# Patient Record
Sex: Male | Born: 1955 | Race: Black or African American | Hispanic: No | Smoking: Never smoker
Health system: Southern US, Community
[De-identification: ages and names within clinical notes are randomized; demographics above are authoritative.]

## PROBLEM LIST (undated history)

## (undated) DIAGNOSIS — F1020 Alcohol dependence, uncomplicated: Secondary | ICD-10-CM

## (undated) DIAGNOSIS — F329 Major depressive disorder, single episode, unspecified: Secondary | ICD-10-CM

## (undated) DIAGNOSIS — F419 Anxiety disorder, unspecified: Secondary | ICD-10-CM

## (undated) DIAGNOSIS — G9340 Encephalopathy, unspecified: Secondary | ICD-10-CM

---

## 2011-01-02 ENCOUNTER — Inpatient Hospital Stay (HOSPITAL_COMMUNITY)
Admission: EM | Admit: 2011-01-02 | Discharge: 2011-01-12 | DRG: 151 | Disposition: A | Discharge status: Home / Self Care | Payer: PRIVATE HEALTH INSURANCE | Attending: Family Medicine | Admitting: Family Medicine

## 2011-01-02 ENCOUNTER — Inpatient Hospital Stay (INDEPENDENT_AMBULATORY_CARE_PROVIDER_SITE_OTHER)
Admission: RE | Admit: 2011-01-02 | Discharge: 2011-01-02 | Disposition: A | Discharge status: Home / Self Care | Payer: Self-pay | Source: Ambulatory Visit | Attending: Family Medicine | Admitting: Family Medicine

## 2011-01-02 ENCOUNTER — Emergency Department (HOSPITAL_COMMUNITY)
Admission: EM | Admit: 2011-01-02 | Discharge: 2011-01-02 | Disposition: A | Discharge status: Home / Self Care | Payer: PRIVATE HEALTH INSURANCE | Attending: Emergency Medicine | Admitting: Emergency Medicine

## 2011-01-02 ENCOUNTER — Emergency Department (HOSPITAL_COMMUNITY): Payer: PRIVATE HEALTH INSURANCE

## 2011-01-02 DIAGNOSIS — T3695XA Adverse effect of unspecified systemic antibiotic, initial encounter: Secondary | ICD-10-CM | POA: Diagnosis not present

## 2011-01-02 DIAGNOSIS — D72829 Elevated white blood cell count, unspecified: Secondary | ICD-10-CM | POA: Diagnosis present

## 2011-01-02 DIAGNOSIS — K921 Melena: Secondary | ICD-10-CM | POA: Diagnosis present

## 2011-01-02 DIAGNOSIS — IMO0002 Reserved for concepts with insufficient information to code with codable children: Secondary | ICD-10-CM | POA: Diagnosis not present

## 2011-01-02 DIAGNOSIS — F29 Unspecified psychosis not due to a substance or known physiological condition: Secondary | ICD-10-CM | POA: Diagnosis not present

## 2011-01-02 DIAGNOSIS — R197 Diarrhea, unspecified: Secondary | ICD-10-CM | POA: Diagnosis not present

## 2011-01-02 DIAGNOSIS — R7402 Elevation of levels of lactic acid dehydrogenase (LDH): Secondary | ICD-10-CM | POA: Diagnosis present

## 2011-01-02 DIAGNOSIS — F3289 Other specified depressive episodes: Secondary | ICD-10-CM | POA: Diagnosis present

## 2011-01-02 DIAGNOSIS — R000 Tachycardia, unspecified: Secondary | ICD-10-CM | POA: Insufficient documentation

## 2011-01-02 DIAGNOSIS — I10 Essential (primary) hypertension: Secondary | ICD-10-CM | POA: Diagnosis present

## 2011-01-02 DIAGNOSIS — F1020 Alcohol dependence, uncomplicated: Secondary | ICD-10-CM | POA: Diagnosis present

## 2011-01-02 DIAGNOSIS — D62 Acute posthemorrhagic anemia: Secondary | ICD-10-CM | POA: Diagnosis present

## 2011-01-02 DIAGNOSIS — R040 Epistaxis: Secondary | ICD-10-CM | POA: Insufficient documentation

## 2011-01-02 DIAGNOSIS — R7401 Elevation of levels of liver transaminase levels: Secondary | ICD-10-CM | POA: Diagnosis present

## 2011-01-02 DIAGNOSIS — F10239 Alcohol dependence with withdrawal, unspecified: Secondary | ICD-10-CM | POA: Diagnosis not present

## 2011-01-02 DIAGNOSIS — E876 Hypokalemia: Secondary | ICD-10-CM | POA: Diagnosis present

## 2011-01-02 DIAGNOSIS — F10939 Alcohol use, unspecified with withdrawal, unspecified: Secondary | ICD-10-CM | POA: Diagnosis not present

## 2011-01-02 DIAGNOSIS — F1010 Alcohol abuse, uncomplicated: Secondary | ICD-10-CM | POA: Diagnosis present

## 2011-01-02 DIAGNOSIS — F329 Major depressive disorder, single episode, unspecified: Secondary | ICD-10-CM | POA: Diagnosis present

## 2011-01-02 DIAGNOSIS — R1110 Vomiting, unspecified: Secondary | ICD-10-CM | POA: Insufficient documentation

## 2011-01-02 LAB — POCT I-STAT, CHEM 8
BUN: 14 mg/dL (ref 6–23)
Chloride: 101 mEq/L (ref 96–112)
HCT: 43 % (ref 39.0–52.0)
Sodium: 138 mEq/L (ref 135–145)

## 2011-01-02 LAB — COMPREHENSIVE METABOLIC PANEL
ALT: 53 U/L (ref 0–53)
ALT: 49 U/L (ref 0–53)
AST: 73 U/L — ABNORMAL HIGH (ref 0–37)
AST: 56 U/L — ABNORMAL HIGH (ref 0–37)
Albumin: 3.7 g/dL (ref 3.5–5.2)
BUN: 25 mg/dL — ABNORMAL HIGH (ref 6–23)
CO2: 23 mEq/L (ref 19–32)
Calcium: 9.6 mg/dL (ref 8.4–10.5)
Creatinine, Ser: 0.71 mg/dL (ref 0.4–1.5)
Creatinine, Ser: 0.69 mg/dL (ref 0.4–1.5)
GFR calc Af Amer: >60 mL/min (ref >60)
GFR calc non Af Amer: >60 mL/min (ref >60)
GFR calc non Af Amer: >60 mL/min (ref >60)
Glucose, Bld: 126 mg/dL — ABNORMAL HIGH (ref 70–99)
Potassium: 3.8 mEq/L (ref 3.5–5.1)
Sodium: 140 mEq/L (ref 135–145)
Total Bilirubin: 0.6 mg/dL (ref 0.3–1.2)

## 2011-01-02 LAB — DIFFERENTIAL
Basophils Relative: 0 % (ref 0–1)
Eosinophils Absolute: 0 10*3/uL (ref 0.0–0.7)
Eosinophils Relative: 0 % (ref 0–5)
Lymphocytes Relative: 4 % — ABNORMAL LOW (ref 12–46)
Lymphs Abs: 1.6 10*3/uL (ref 0.7–4.0)
Lymphs Abs: 0.6 10*3/uL — ABNORMAL LOW (ref 0.7–4.0)
Monocytes Absolute: 1.2 10*3/uL — ABNORMAL HIGH (ref 0.1–1.0)
Monocytes Relative: 7 % (ref 3–12)
Monocytes Relative: 8 % (ref 3–12)
Neutro Abs: 12.3 10*3/uL — ABNORMAL HIGH (ref 1.7–7.7)
Neutrophils Relative %: 82 % — ABNORMAL HIGH (ref 43–77)

## 2011-01-02 LAB — CBC
HCT: 34.7 % — ABNORMAL LOW (ref 39.0–52.0)
Hemoglobin: 12.9 g/dL — ABNORMAL LOW (ref 13.0–17.0)
MCH: 30.3 pg (ref 26.0–34.0)
MCH: 29.8 pg (ref 26.0–34.0)
MCHC: 34.6 g/dL (ref 30.0–36.0)
MCV: 86.4 fL (ref 78.0–100.0)
MCV: 86.1 fL (ref 78.0–100.0)
Platelets: 154 10*3/uL (ref 150–400)
RBC: 4.26 MIL/uL (ref 4.22–5.81)
RDW: 14.1 % (ref 11.5–15.5)
WBC: 15.1 10*3/uL — ABNORMAL HIGH (ref 4.0–10.5)
WBC: 14.9 10*3/uL — ABNORMAL HIGH (ref 4.0–10.5)

## 2011-01-02 LAB — GLUCOSE, CAPILLARY: Glucose-Capillary: 123 mg/dL — ABNORMAL HIGH (ref 70–99)

## 2011-01-02 LAB — URINALYSIS, ROUTINE W REFLEX MICROSCOPIC
Bilirubin Urine: NEGATIVE
Hgb urine dipstick: NEGATIVE
Specific Gravity, Urine: 1.018 (ref 1.005–1.030)
Urobilinogen, UA: 0.2 mg/dL (ref 0.0–1.0)

## 2011-01-02 LAB — RAPID URINE DRUG SCREEN, HOSP PERFORMED
Amphetamines: NOT DETECTED
Benzodiazepines: NOT DETECTED
Opiates: NOT DETECTED
Tetrahydrocannabinol: NOT DETECTED

## 2011-01-02 LAB — APTT: aPTT: 24 seconds (ref 24–37)

## 2011-01-02 LAB — PROTIME-INR: Prothrombin Time: 13.1 seconds (ref 11.6–15.2)

## 2011-01-03 LAB — COMPREHENSIVE METABOLIC PANEL
ALT: 40 U/L (ref 0–53)
AST: 42 U/L — ABNORMAL HIGH (ref 0–37)
Albumin: 3.6 g/dL (ref 3.5–5.2)
Calcium: 9 mg/dL (ref 8.4–10.5)
GFR calc Af Amer: >60 mL/min (ref >60)
Sodium: 142 mEq/L (ref 135–145)
Total Protein: 7.1 g/dL (ref 6.0–8.3)

## 2011-01-03 LAB — CBC
HCT: 30.2 % — ABNORMAL LOW (ref 39.0–52.0)
Hemoglobin: 11.3 g/dL — ABNORMAL LOW (ref 13.0–17.0)
Hemoglobin: 10.5 g/dL — ABNORMAL LOW (ref 13.0–17.0)
MCHC: 34.5 g/dL (ref 30.0–36.0)
Platelets: 164 10*3/uL (ref 150–400)
RBC: 3.44 MIL/uL — ABNORMAL LOW (ref 4.22–5.81)
RDW: 14.4 % (ref 11.5–15.5)
RDW: 14.4 % (ref 11.5–15.5)
WBC: 10 10*3/uL (ref 4.0–10.5)

## 2011-01-03 LAB — HEMOGLOBIN AND HEMATOCRIT, BLOOD: Hemoglobin: 10.9 g/dL — ABNORMAL LOW (ref 13.0–17.0)

## 2011-01-03 LAB — T3: T3, Total: 67 ng/dl — ABNORMAL LOW (ref 80.0–204.0)

## 2011-01-03 LAB — APTT: aPTT: 23 seconds — ABNORMAL LOW (ref 24–37)

## 2011-01-03 LAB — DIFFERENTIAL
Basophils Absolute: 0 10*3/uL (ref 0.0–0.1)
Basophils Relative: 0 % (ref 0–1)
Eosinophils Absolute: 0 10*3/uL (ref 0.0–0.7)
Eosinophils Relative: 0 % (ref 0–5)
Monocytes Absolute: 0.9 10*3/uL (ref 0.1–1.0)
Neutro Abs: 9.4 10*3/uL — ABNORMAL HIGH (ref 1.7–7.7)

## 2011-01-03 LAB — TSH: TSH: 1.015 u[IU]/mL (ref 0.350–4.500)

## 2011-01-03 LAB — PROTIME-INR: INR: 0.91 (ref 0.00–1.49)

## 2011-01-03 LAB — T4: T4, Total: 4.8 ug/dL — ABNORMAL LOW (ref 5.0–12.5)

## 2011-01-03 NOTE — H&P (Signed)
[name], [name]             ACCOUNT NO.:  [id number]  MEDICAL RECORD NO.:  [id number]           PATIENT TYPE:  LOCATION:                                 FACILITY:  PHYSICIAN:  Talmage Nap, MD  DATE OF BIRTH:  1955/10/16  DATE OF ADMISSION:  01/02/2011 DATE OF DISCHARGE:                             HISTORY & PHYSICAL   PRIMARY CARE PHYSICIAN:  Unassigned.  History obtainable from the patient and the patient's sister.  CHIEF COMPLAINT:  Recurrent nosebleed for about a week duration.  HISTORY:  The patient is a 55 year old African American male with no known medical history, but has a history of chronic alcohol abuse presenting to the emergency room with a nosebleed, which was said to have been recurrent for the past 1 week, but got progressively worse 24 hours prior to presenting to the emergency room.  The patient was said to have denied any history of trauma.  He denied any history of nose picking.  He denied any fever.  He denied any rigor.  Initially, nosebleed was said to wax and wane over 24 hours prior to presenting to the emergency room.  The bleeding was said to have been very torrential and was coming from both nostrils.  The patient was said to have been getting progressively weak and subsequently was brought to the emergency room by his sister for evaluation.  PAST MEDICAL HISTORY:  Unknown.  PAST SURGICAL HISTORY:  No known past surgical history.  MEDICATIONS:  Not on any medications.  ALLERGIES:  No known allergies.  SOCIAL HISTORY:  The patient is a heavy drinker, cannot quantify the number of bottles of alcohol he takes every day.  Denies any history of tobacco or street drug use.  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  FAMILY HISTORY:  Positive for bleeding diastasis.  REVIEW OF SYSTEMS:  The patient denies any history of headaches.  No blurred vision.  No nausea or vomiting.  No fever.  No chills.  No rigor.  No chest pain or  shortness of breath.  Complained of persistent nosebleed, worse on the left than on the right.  No cough.  No abdominal discomfort.  No diarrhea or hematochezia.  No dysuria or hematuria.  No swelling of the lower extremities.  No intolerance to heat or cold and no neuropsychiatric disorder.  PHYSICAL EXAMINATION:  GENERAL:  A middle-aged man not in any acute respiratory distress, suboptimal hydration present. VITAL SIGNS:  Blood pressure is 177/112, pulse is 136, respiratory rate 18, saturating 95% on room air. HEENT:  Pallor.  Pupils are reactive to light.  Extraocular muscles are intact.  He has a nasal packing in situ in the left nostril. NECK:  No jugular venous distention.  No carotid bruits.  No lymphadenopathy. CHEST:  Clear to auscultation. HEART:  Sounds are S1, S2.  Tachycardic. ABDOMEN:  Soft, nontender.  Liver, spleen, kidney not palpable.  Bowel sounds are positive. EXTREMITIES:  No pedal edema. NEUROLOGIC:  Nonfocal. MUSCULOSKELETAL:  Unremarkable. SKIN:  Shows slightly decreased turgor.  LABORATORY DATA:  Urine drug screen negative.  Urinalysis unremarkable. Alcohol level is less  than 11.  Chemistry shows sodium of 140, potassium of 3.8, chloride of 100 with bicarb of 27, glucose is 126, BUN is 25, creatinine is 0.69.  LFTs showed total protein is 7.5; albumin is 3.9; AST 56, elevated; ALT 49, normal; and alkaline phosphatase is 90, normal.  Hematological indices showed WBC of 14.9, hemoglobin of 12.0, hematocrit of 34.7, MCV of 86.2 with a platelet count of 454, neutrophils 80%, and absolute neutrophil count is 10.2.  IMAGING STUDIES:  Done include chest x-ray, which was essentially normal.  IMPRESSION: 1. Recurrent epistaxis, status post nasal packing. 2. Tachycardia. 3. Elevated blood pressure. 4. Anemia. 5. Leukocytosis. 6. Abnormal liver function tests secondary to chronic EtOH use. 7. History of EtOH use.  PLAN:  Plan is to admit the patient to  telemetry because of the tachycardia and elevated blood pressures.  He will be on half-normal saline IV to go at a rate of 65 cc an hour.  We will continue nasal packing.  He will be on clindamycin 600 mg IV q.8 hourly, Vasotec 1.25 mg IV q.6 and then Lopressor 5 mg IV q.6 p.r.n. for heart beat greater than 90 beats per minute. GI prophylaxis will be with Protonix 40 IV q.24 and then DVT prophylaxis with TED stockings and SCDs boots.  Labs to be ordered on this patient include hemoglobin and hematocrit q.6 h.; PT/PTT/INR stat; thyroid panel, which include TSH, T3, and T4; CBC, CMP, and magnesium repeated in a.m.  The patient will have a blood culture x2 done before starting IV antibiotics.  I was informed by the ED physician that ENT surgeon has been consulted.  The patient will be followed and evaluated on a daily basis.     Talmage Nap, MD     CN/MEDQ  D:  01/03/2011  T:  01/03/2011  Job:  [id number]  Electronically Signed by Talmage Nap  on 01/03/2011 07:23:36 AM

## 2011-01-04 LAB — COMPREHENSIVE METABOLIC PANEL
ALT: 34 U/L (ref 0–53)
AST: 52 U/L — ABNORMAL HIGH (ref 0–37)
CO2: 28 mEq/L (ref 19–32)
Chloride: 102 mEq/L (ref 96–112)
Creatinine, Ser: 0.77 mg/dL (ref 0.4–1.5)
GFR calc Af Amer: >60 mL/min (ref >60)
GFR calc non Af Amer: >60 mL/min (ref >60)
Total Bilirubin: 0.6 mg/dL (ref 0.3–1.2)

## 2011-01-04 LAB — CBC
Hemoglobin: 11 g/dL — ABNORMAL LOW (ref 13.0–17.0)
MCH: 30.5 pg (ref 26.0–34.0)
RBC: 3.61 MIL/uL — ABNORMAL LOW (ref 4.22–5.81)

## 2011-01-05 LAB — CBC
MCH: 30.9 pg (ref 26.0–34.0)
MCHC: 35.4 g/dL (ref 30.0–36.0)
MCHC: 34.5 g/dL (ref 30.0–36.0)
MCV: 87.4 fL (ref 78.0–100.0)
MCV: 87.4 fL (ref 78.0–100.0)
Platelets: 159 10*3/uL (ref 150–400)
Platelets: 155 10*3/uL (ref 150–400)
RDW: 13.6 % (ref 11.5–15.5)
RDW: 13.6 % (ref 11.5–15.5)
WBC: 9.8 10*3/uL (ref 4.0–10.5)

## 2011-01-05 LAB — BASIC METABOLIC PANEL
BUN: 10 mg/dL (ref 6–23)
Creatinine, Ser: 0.64 mg/dL (ref 0.4–1.5)
GFR calc non Af Amer: >60 mL/min (ref >60)

## 2011-01-05 LAB — HEPATITIS PANEL, ACUTE
HCV Ab: NEGATIVE
Hepatitis B Surface Ag: NEGATIVE

## 2011-01-06 ENCOUNTER — Inpatient Hospital Stay (HOSPITAL_COMMUNITY): Payer: PRIVATE HEALTH INSURANCE

## 2011-01-06 ENCOUNTER — Inpatient Hospital Stay (HOSPITAL_COMMUNITY): Payer: Self-pay

## 2011-01-06 LAB — COMPREHENSIVE METABOLIC PANEL
AST: 45 U/L — ABNORMAL HIGH (ref 0–37)
BUN: 6 mg/dL (ref 6–23)
CO2: 26 mEq/L (ref 19–32)
Calcium: 8.4 mg/dL (ref 8.4–10.5)
Creatinine, Ser: 0.67 mg/dL (ref 0.4–1.5)
GFR calc Af Amer: >60 mL/min (ref >60)
GFR calc non Af Amer: >60 mL/min (ref >60)
Total Bilirubin: 0.2 mg/dL — ABNORMAL LOW (ref 0.3–1.2)

## 2011-01-06 LAB — CBC
Hemoglobin: 9.4 g/dL — ABNORMAL LOW (ref 13.0–17.0)
MCH: 30.8 pg (ref 26.0–34.0)
MCHC: 35.3 g/dL (ref 30.0–36.0)
MCV: 87.2 fL (ref 78.0–100.0)

## 2011-01-06 LAB — CLOSTRIDIUM DIFFICILE BY PCR: Toxigenic C. Difficile by PCR: NEGATIVE

## 2011-01-07 LAB — BASIC METABOLIC PANEL
CO2: 29 mEq/L (ref 19–32)
Chloride: 100 mEq/L (ref 96–112)
GFR calc Af Amer: >60 mL/min (ref >60)
Potassium: 3.2 mEq/L — ABNORMAL LOW (ref 3.5–5.1)
Sodium: 137 mEq/L (ref 135–145)

## 2011-01-07 LAB — CBC
Hemoglobin: 9.3 g/dL — ABNORMAL LOW (ref 13.0–17.0)
MCH: 30.7 pg (ref 26.0–34.0)
Platelets: 189 10*3/uL (ref 150–400)
RBC: 3.03 MIL/uL — ABNORMAL LOW (ref 4.22–5.81)
WBC: 8.9 10*3/uL (ref 4.0–10.5)

## 2011-01-07 LAB — MAGNESIUM: Magnesium: 1.6 mg/dL (ref 1.5–2.5)

## 2011-01-07 NOTE — Consult Note (Signed)
[name], [name]             ACCOUNT NO.:  [id number]  MEDICAL RECORD NO.:  [id number]           PATIENT TYPE:  I  LOCATION:  1440                         FACILITY:  [facility]  PHYSICIAN:  Marka Treloar T. Lazarus Salines, M.D. DATE OF BIRTH:  October 18, 1955  DATE OF CONSULTATION:  01/03/2011 DATE OF DISCHARGE:                                CONSULTATION   CHIEF COMPLAINT:  Left epistaxis.  HISTORY OF PRESENT ILLNESS:  A 55 year old black male with an extensive alcohol use history.  Has been having episodic severe left-sided nosebleeds for the past week.  He received some packing to the nose several days ago which was pulled out.  He had bleeding again yesterday and packing was placed.  ENT was called today when the patient continued to bleed around the packing.  Coagulation profile was normal, although platelets were slightly low at 164,000.  He takes occasional aspirin but no other blood thinners.  No surgery to the nose.  No trauma to the nose.  The bleeding is essentially unprovoked.  He is not bleeding elsewhere on his body and no known bleeding tendencies.  No nasal obstruction or pain except for the packing.  PHYSICAL EXAMINATION:  This is a thin middle-aged black male.  He is cooperative and appropriate but somewhat sedated.  The head is atraumatic and neck is supple.  Cranial nerves grossly intact.  I did not examine his ears.  Oral cavity and pharynx clear.  Neck unremarkable.  Anterior nose shows a balloon/Merocel pack in the left nose which is deflated and removed.  The right nasal mucosa looks healthy with no evidence of perforation, ulceration, or granuloma.  On the left side, there is a slight rightward septal deviation with concavity to the left.  There is some crust in the nares.  There were clots up in the middle meatus which I was able to suction evacuate.  No bleeding was observed.  4% cocaine solution, 4 mL total was applied on cotton pledgets to the nasal septum high and  low in the left and then into the middle meatus on the left and medial to the middle turbinate. Several minutes were allowed for this to take effect.  There was mild oozing on the free edge of the middle turbinate which was controlled with silver nitrate cautery.  The quantity was really quite small.  The turbinate was then fractured to a degree and the remaining clots on the middle meatus were evacuated.  No obvious bleeding site was noted.  No active bleeding ensued.  Assuming the middle meatus source for the bleeding, middle meatus was packed with fibrillar Surgicel with good tolerance.  No further bleeding was noted.  He was observed for several minutes.  IMPRESSION:  Left epistaxis, probably middle meatus.  Possible contributions from alcoholic liver derangement.  PLAN:  We will recommend nasal hygiene measures, oral antibiotics.  The packing will fall out spontaneously.  I will check him back in my office if needed for continued bleeding or recurrent bleeding.     Gloris Manchester. Lazarus Salines, M.D.     KTW/MEDQ  D:  01/03/2011  T:  01/04/2011  Job:  [id number]  Electronically Signed by Flo Shanks M.D. on 01/07/2011 10:50:36 AM

## 2011-01-08 LAB — BASIC METABOLIC PANEL
CO2: 29 mEq/L (ref 19–32)
Chloride: 100 mEq/L (ref 96–112)
Creatinine, Ser: 0.73 mg/dL (ref 0.4–1.5)
GFR calc Af Amer: >60 mL/min (ref >60)
Potassium: 3.8 mEq/L (ref 3.5–5.1)
Sodium: 137 mEq/L (ref 135–145)

## 2011-01-08 LAB — MAGNESIUM: Magnesium: 1.8 mg/dL (ref 1.5–2.5)

## 2011-01-09 LAB — CULTURE, BLOOD (ROUTINE X 2)
Culture  Setup Time: 201205250824
Culture: NO GROWTH

## 2011-01-10 LAB — CBC
HCT: 27.4 % — ABNORMAL LOW (ref 39.0–52.0)
MCV: 88.1 fL (ref 78.0–100.0)
RBC: 3.11 MIL/uL — ABNORMAL LOW (ref 4.22–5.81)
RDW: 14.3 % (ref 11.5–15.5)
WBC: 7.4 10*3/uL (ref 4.0–10.5)

## 2011-01-10 LAB — BASIC METABOLIC PANEL
GFR calc non Af Amer: >60 mL/min (ref >60)
Potassium: 3.5 mEq/L (ref 3.5–5.1)
Sodium: 137 mEq/L (ref 135–145)

## 2011-01-12 NOTE — Op Note (Signed)
  [name], [name]             ACCOUNT NO.:  [id number]  MEDICAL RECORD NO.:  [id number]           PATIENT TYPE:  I  LOCATION:  1440                         FACILITY:  [facility]  PHYSICIAN:  Netra Postlethwait L. Malon Kindle., M.D.DATE OF BIRTH:  12-Jun-1956  DATE OF PROCEDURE: DATE OF DISCHARGE:                              OPERATIVE REPORT   PROCEDURE:  Esophagogastroduodenoscopy.  MEDICATIONS:  Cetacaine spray, fentanyl 75 mcg, Versed 8 mg IV.  INDICATIONS:  The patient admitted with epistaxis, has a history of drinking, has very slight elevation of liver tests.  He has had a drop in hemoglobin and positive stools.  This occurred 2 days after his nose had been packed and all bleeding from his nose apparently had stopped. This procedure was done to rule out varices, ulcers or any other source of upper GI bleeding.  DESCRIPTION OF PROCEDURE:  Procedure explained to the patient, consent obtained.  Left lateral decubitus position, we attempted to pass the Pentax adult endoscope.  The more sedation, the more agitated he became, we were unable to pass the adult scope.  I then switched over to the pediatric scope and were able to pass the pediatric Pentax scope indirectly with agglutination.  The patient remained agitated throughout the procedure and was quite stiff in spite of significant amount of sedation.  The esophagus was entered.  There was no active bleeding.  No esophagitis.  No esophageal varices.  The stomach was entered.  There was no active bleeding in the stomach.  No ulcerations, gastritis or any other abnormalities.  The duodenal bulb and second duodenum were free of lesions and there were no signs at all of any active bleeding.  The scope was withdrawn and the initial findings were confirmed.  The patient in general tolerated the procedure well.  ASSESSMENT: 1. No active bleeding. 2. No signs of esophageal or gastric varices.  PLAN:  We will follow the patient clinically.  We  would encourage him to stop drinking if at all possible.          ______________________________ Llana Aliment. Malon Kindle., M.D.     Waldron Session  D:  01/06/2011  T:  01/06/2011  Job:  [id number]  Electronically Signed by Carman Ching M.D. on 01/12/2011 08:28:36 PM

## 2011-01-12 NOTE — Consult Note (Signed)
[name], [name]             ACCOUNT NO.:  [id number]  MEDICAL RECORD NO.:  [id number]           PATIENT TYPE:  I  LOCATION:  1440                         FACILITY:  [facility]  PHYSICIAN:  Chamika Cunanan L. Malon Kindle., M.D.DATE OF BIRTH:  1955-11-11  DATE OF CONSULTATION:  01/05/2011 DATE OF DISCHARGE:                                CONSULTATION   REQUESTED BY:  [facility] hospitalist.  PRIMARY CARE DOCTOR:  None.  The patient is unassigned  REASON FOR CONSULTATION:  Melena.  HISTORY:  The patient is a 55 year old gentleman who was admitted with epistaxis and had packing of his nose by Dr. Lazarus Salines.  He has a history of alcohol use and is a quite heavy drinker.  He has never had any problems related to his liver.  His hemoglobin has been stable for the past 2 days of 11.3 or so but it has dropped acutely to 9.8.  He has begun to have melenic stools.  His nose was packed by Dr. Lazarus Salines just 2 days ago and there is no ongoing GI bleeding.  The patient has never had ulcers.  Denies heartburn, indigestion.  Takes one aspirin daily. Denies any other nonsteroidal use.  He denies abdominal pain.  We are asked to see him regarding his drop in hemoglobin and increase in melenic stools 2 days after his nose was packed and the epistaxis was controlled.  CURRENT MEDICATIONS:  Norvasc, bacitracin, Keflex, Lexapro, folic acid, Ativan, Protonix, thiamine and metoprolol.  ALLERGIES:  He has no drug allergies.  MEDICAL HISTORY:  He has no significant past medical history and no surgical history.  FAMILY HISTORY:  Per the patient's sister, grandfather had cancer, type of cancer unknown to the best of their knowledge.  There is no colon cancer in the family.  SOCIAL HISTORY:  He is a heavy drinker but does not smoke or use any other drug use.  PHYSICAL EXAMINATION:  VITAL SIGNS:  Temperature 97.3, pulse 98, blood pressure 141/89. GENERAL:  A pleasant African-American male, in no acute  distress. HEENT:  Nose.  There is no packing in the nose and no obvious epistaxis at this time. LUNGS:  Clear. HEART:  Regular rate and rhythm without murmurs, gallops. ABDOMEN:  Soft and completely nontender. RECTAL:  Not performed.  No stool was sent, and was guaiac positive.  PERTINENT LABS:  Hemoglobin has dropped from 12.9 on admission to 9.8 this morning.  ASSESSMENT:  Melena and drop in hemoglobin - this likely is all due to his epistaxis, however, it would certainly be a good idea to evaluate endoscopically.  His liver tests are essentially normal with a normal protime, bilirubin, minimal elevation of transaminases.  I think he is at low risk for esophageal varices and this does not clinically appear to be a variceal bleed.  PLAN:  We will go ahead and plan an elective endoscopy tomorrow.  I have discussed with the patient's sister.  Will proceed with emergent endoscopy tonight should he bleed acutely.          ______________________________ Llana Aliment Malon Kindle., M.D.     Waldron Session  D:  01/05/2011  T:  01/05/2011  Job:  [id number]  Electronically Signed by Carman Ching M.D. on 01/12/2011 91:47:82 PM

## 2011-01-16 NOTE — Group Therapy Note (Signed)
[name], [name]             ACCOUNT NO.:  [id number]  MEDICAL RECORD NO.:  [id number]           PATIENT TYPE:  I  LOCATION:  1440                         FACILITY:  [facility]  PHYSICIAN:  Osvaldo Shipper, MD     DATE OF BIRTH:  16-Mar-1956                                PROGRESS NOTE   PRIMARY CARE PHYSICIAN: The patient does not have primary care physician.  He will be assigned to [facility] hopefully.  CONSULTATIONS: Consultations during this admission: 1. Dr. Lazarus Salines with ENT for epistaxis. 2. Dr. Carman Ching with Gastroenterology for melena.  PROCEDURES: Procedures performed during this admission include EGD which showed no active bleeding, no varices or any other findings of gastritis etc.  IMAGING STUDIES: Imaging studies done include: 1. A chest x-ray which showed no active cardiopulmonary process. 2. Ultrasound of the abdomen showed small amount of ascites,     otherwise, benign appearing abdomen.  LABORATORY DATA:Pertinent labs include white cell count of 15,000 at the time of admission.  Hemoglobin stable between 9 and 10 from a baseline of 12.9. Coags were normal.  Electrolytes, he does have some hypokalemia and relatively mild hypomagnesemia.  Thyroid function tests were unremarkable.  Hepatitis profile was negative.  Urine drug screen was negative.  C diff was negative.  Blood cultures were negative.  CURRENT DIAGNOSES: 1. Alcohol withdrawal syndrome. 2. Acute diarrhea, etiology unclear. 3. Epistaxis, resolved. 4. Depression, stable. 5. Alcohol abuse, requires rehab. 6. Electrolyte imbalance. 7. Hypertension.  BRIEF HOSPITAL COURSE.: 1. Epistaxis.  This is a 55 year old African-American male who     presented to the hospital with bleeding from the left nostril.  A     pack was placed in the ED.  However, the patient continued to have     bleeding around the packing.  He was subsequently seen by Dr.     Lazarus Salines who cauterized the lesions with some  cocaine.  The bleeding     has subsequently stopped.  His hemoglobin has stabilized.  He will     need to see Dr. Lazarus Salines in 2 weeks. 2. Possible melena.  The patient started having black stools.  This is     most likely swallowed blood from his nose.  However, when he     started having melena, his hemoglobin dropped a little bit more, so     we consulted GI and because of his alcohol history, he underwent     EGD which did not show any bleeding lesions in the stomach or     esophagus or duodenum.  PPI was continued at this time. 3. Alcohol abuse and alcohol withdrawal.  This is a patient who drinks     6-pack of beer on a daily basis.  Apparently he has been doing it     ever since his grandfather passed last year.  The patient was put     on CIWA Ativan protocol, was put on thiamine.  Initially his     symptoms seem to resolve.  However, over the last 12 hours or so,     the patient has been agitated, confused, trying to get  out of bed,     is tremulous.  So we are going to reinitiate his Ativan to see how     he responds over the next 24 to 48 hours. 4. Depression.  Since he is still upset over the loss of his     grandfather and it has been about a year, he is way pass his normal     grief reaction.  He appears to be quite depressed and this is also     according to his sister, so we went ahead and initiated the patient     on Lexapro 5. Diarrhea.  Etiology is unclear.  Initially he was on clindamycin     for a day.  We suspected C diff, however C diff has some back     negative.  I am looking at all the medications.  Since he does not     have gastritis, I am  going to discontinue the Protonix as that can     cause diarrhea.  He is on Keflex for the epistaxis and possible     infection around that site.  It is unclear if that is causing the     diarrhea.  We tried Imodium with no relief.  We will try Lomotil     for a day or so and see the response.  I will discontinue the      Protonix today.  Consideration may have to be given to stopping the     Keflex as well at some point. 6. Hypokalemia and hypomagnesemia, will be repleted. 7. Hypertension.  He was started on Norvasc.  Please continue that.     His blood pressure is still elevated, however, that is probably     from his withdrawal process.  So basically we are waiting on his withdrawal symptoms to improve, his diarrhea to improve.  Then social worker will need to determine if he needs inpatient or outpatient alcohol rehab.  Please see written note for his examination findings.     Osvaldo Shipper, MD     GK/MEDQ  D:  01/07/2011  T:  01/07/2011  Job:  [id number]  Electronically Signed by Osvaldo Shipper MD on 01/16/2011 11:24:56 PM

## 2011-01-24 NOTE — Discharge Summary (Signed)
[name], [name]             ACCOUNT NO.:  [id number]  MEDICAL RECORD NO.:  [id number]           PATIENT TYPE:  I  LOCATION:  1440                         FACILITY:  [facility]  PHYSICIAN:  Brendia Sacks, MD    DATE OF BIRTH:  Feb 10, 1956  DATE OF ADMISSION:  01/02/2011 DATE OF DISCHARGE:  01/12/2011                              DISCHARGE SUMMARY   PRIMARY CARE PHYSICIAN:  Tresa Endo L. Philipp Deputy, M.D., [facility]  PRIMARY GASTROENTEROLOGIST:  Llana Aliment. Randa Evens, M.D.  PRIMARY EAR NOSE THROAT PHYSICIAN:  Zola Button T. Lazarus Salines, M.D.  CONDITION ON DISCHARGE:  Improved.  DISPOSITION:  Home.  DISCHARGE DIAGNOSES: 1. Acute alcohol withdrawal with hallucinations, resolved. 2. Epistaxis, resolved. 3. Melena, resolved. 4. Depression, stable. 5. Alcohol dependence.  HISTORY OF PRESENT ILLNESS:  This is a 55 year old man who presented to the emergency room with recurrent nosebleeds.  He was admitted for nasal packing and ENT consultation.HOSPITAL COURSE: 1. Epistaxis.  The patient was seen in consultation with Dr. Lazarus Salines     who had cauterized the lesion and had applied cocaine.  Bleeding     subsequently stopped.  Packing has spontaneously fallen out.  He     will follow up with Dr. Lazarus Salines in the outpatient setting in     approximately 1 week.  He has had no further bleeding. 2. Questionable melena.  The patient had had some black stools.  This     was felt to be secondary to swallowed blood from his nose, however,     as his hemoglobin had dropped a bit, he was seen in consultation     with Gastroenterology, especially in light of his alcohol history     and he underwent an EGD, which did not show any bleeding or lesions     in the stomach, esophagus, or duodenum. 3. Alcohol withdrawal with hallucinations.  The patient has been     drinking since he was quite young.  He did develop acute alcohol     withdrawal with hallucinations and was placed on the CIWA.  His     condition has  improved.  His hallucinations have resolved.  He is     alert and oriented and is stable for discharge.  He was seen     consultation with Clinical Social Work and an inpatient and     outpatient options were presented to him.  He prefers to go with     intensive outpatient therapy.  They have also recommended     Alcoholics Anonymous. 4. Depression.  He has been quite depressed.  He would like to     continue on a trial of antidepressants, which he has been on here     in the hospital.  He has been counseled not to drink alcohol while     on antidepressants. 5. Diarrhea, C difficile negative.  Likely related to antibiotic side     effect.  I expect that this will improve in the outpatient setting.  CONSULTATIONS: 1. Gastroenterology, recommendations as above. 2. Ear, Nose, and Throat, recommendations as above.  PROCEDURES:  Esophagogastroduodenoscopy, May 28th:  No active bleeding. No  signs of esophageal or gastric varices.  MICROBIOLOGY:  Blood cultures x2, no growth, final.  PERTINENT LABORATORY STUDIES: 1. Serum alcohol on admission was unremarkable. 2. Urine drug screen was negative. 3. TSH was within normal limits, although T3 and T4 were just     equivocally low. 4. Acute hepatitis panel was negative. 5. C difficile PCR was negative. 6. CBC was notable for a hemoglobin of 14.6 on admission, lowest     hemoglobin was 9.3, on discharge 9.5, stable.  Leukocytosis on     admission 15.1, it has normalized to 7.4 on discharge, platelets     within normal limits. 7. Basic metabolic panel is essentially unremarkable. 8. Hepatic function panel was notable for elevation of AST, minimally     above normal.  This has remained stable and is likely related to     alcohol.  PHYSICAL EXAMINATION ON DISCHARGE:  GENERAL:  The patient is feeling well.  He has had no further hallucinations.  He feels ready to go home, sister appears to agree. VITAL SIGNS:  Temperature is 98.5, pulse 76,  respirations 18, blood pressure 123/79, 100% on room air. CARDIOVASCULAR:  Regular rate and rhythm.  No murmur, rub, or gallop. RESPIRATORY:  Clear to auscultation bilaterally.  No wheezes, rales, or rhonchi.  Normal respiratory effort. PSYCHIATRIC:  Grossly normal mood and affect.  Speech is fluent and appropriate.  He is alert and oriented to himself, to [facility], to January 12, 2011.  DISCHARGE INSTRUCTIONS:  The patient will be discharged home today.  DIET:  Unrestricted.  ACTIVITY:  Unrestricted.  FOLLOWUP:  He will follow up at [facility] for eligibility on January 23, 2011, at 4 p.m. and with Dr. Philipp Deputy at [facility] on January 29, 2011, at 2:30 p.m., follow up with Dr. Lazarus Salines in 1 week and with Dr. Randa Evens from Gastroenterology in a few weeks to discuss colonoscopy.  He also has elected intensive outpatient therapy.  I have recommend Alcoholics Anonymous.  Recommend complete alcohol cessation.  DISCHARGE MEDICATIONS: 1. Celexa 20 mg p.o. daily. 2. Folic acid 1 mg p.o. daily. 3. Multivitamin p.o. daily. 4. Thiamine 100 mg p.o. daily.  TIME COORDINATING DISCHARGE:  30 minutes.     Brendia Sacks, MD     DG/MEDQ  D:  01/12/2011  T:  01/13/2011  Job:  [id number]  cc:   Tresa Endo L. Philipp Deputy, M.D. Fax: [phone]  Llana Aliment. Malon Kindle., M.D. Fax: [phone]  Gloris Manchester. Lazarus Salines, M.D. Fax: [phone]  Electronically Signed by Brendia Sacks  on 01/24/2011 05:19:37 PM

## 2012-09-13 ENCOUNTER — Encounter (HOSPITAL_COMMUNITY): Payer: Self-pay | Admitting: *Deleted

## 2012-09-13 ENCOUNTER — Emergency Department (HOSPITAL_COMMUNITY)
Admission: EM | Admit: 2012-09-13 | Discharge: 2012-09-14 | Disposition: A | Discharge status: Home / Self Care | Payer: Self-pay | Attending: Emergency Medicine | Admitting: Emergency Medicine

## 2012-09-13 ENCOUNTER — Emergency Department (HOSPITAL_COMMUNITY): Payer: Self-pay

## 2012-09-13 DIAGNOSIS — F10929 Alcohol use, unspecified with intoxication, unspecified: Secondary | ICD-10-CM

## 2012-09-13 DIAGNOSIS — F1010 Alcohol abuse, uncomplicated: Secondary | ICD-10-CM | POA: Insufficient documentation

## 2012-09-13 DIAGNOSIS — Z79899 Other long term (current) drug therapy: Secondary | ICD-10-CM | POA: Insufficient documentation

## 2012-09-13 DIAGNOSIS — R7309 Other abnormal glucose: Secondary | ICD-10-CM | POA: Insufficient documentation

## 2012-09-13 DIAGNOSIS — Z8669 Personal history of other diseases of the nervous system and sense organs: Secondary | ICD-10-CM | POA: Insufficient documentation

## 2012-09-13 DIAGNOSIS — Z8659 Personal history of other mental and behavioral disorders: Secondary | ICD-10-CM | POA: Insufficient documentation

## 2012-09-13 DIAGNOSIS — R55 Syncope and collapse: Secondary | ICD-10-CM | POA: Insufficient documentation

## 2012-09-13 DIAGNOSIS — E162 Hypoglycemia, unspecified: Secondary | ICD-10-CM

## 2012-09-13 LAB — COMPREHENSIVE METABOLIC PANEL
ALT: 38 U/L (ref 0–53)
AST: 111 U/L — ABNORMAL HIGH (ref 0–37)
Albumin: 3.6 g/dL (ref 3.5–5.2)
Alkaline Phosphatase: 106 U/L (ref 39–117)
BUN: 11 mg/dL (ref 6–23)
CO2: 25 mEq/L (ref 19–32)
Calcium: 8.6 mg/dL (ref 8.4–10.5)
Chloride: 101 mEq/L (ref 96–112)
Creatinine, Ser: 0.71 mg/dL (ref 0.50–1.35)
GFR calc Af Amer: >90 mL/min (ref >90)
GFR calc non Af Amer: >90 mL/min (ref >90)
Glucose, Bld: 79 mg/dL (ref 70–99)
Potassium: 3.4 mEq/L — ABNORMAL LOW (ref 3.5–5.1)
Sodium: 141 mEq/L (ref 135–145)
Total Bilirubin: 0.3 mg/dL (ref 0.3–1.2)
Total Protein: 7.9 g/dL (ref 6.0–8.3)

## 2012-09-13 LAB — URINALYSIS, ROUTINE W REFLEX MICROSCOPIC
Bilirubin Urine: NEGATIVE
Glucose, UA: 100 mg/dL — AB
Ketones, ur: NEGATIVE mg/dL
Leukocytes, UA: NEGATIVE
Nitrite: NEGATIVE
Protein, ur: 30 mg/dL — AB
Specific Gravity, Urine: 1.013 (ref 1.005–1.030)
Urobilinogen, UA: 0.2 mg/dL (ref 0.0–1.0)
pH: 5 (ref 5.0–8.0)

## 2012-09-13 LAB — CBC
HCT: 39.1 % (ref 39.0–52.0)
Hemoglobin: 14.1 g/dL (ref 13.0–17.0)
MCH: 30.5 pg (ref 26.0–34.0)
MCHC: 36.1 g/dL — ABNORMAL HIGH (ref 30.0–36.0)
MCV: 84.6 fL (ref 78.0–100.0)
Platelets: 162 10*3/uL (ref 150–400)
RBC: 4.62 MIL/uL (ref 4.22–5.81)
RDW: 12.8 % (ref 11.5–15.5)
WBC: 9.6 10*3/uL (ref 4.0–10.5)

## 2012-09-13 LAB — RAPID URINE DRUG SCREEN, HOSP PERFORMED
Amphetamines: NOT DETECTED
Barbiturates: NOT DETECTED
Benzodiazepines: NOT DETECTED
Cocaine: NOT DETECTED
Opiates: NOT DETECTED
Tetrahydrocannabinol: NOT DETECTED

## 2012-09-13 LAB — GLUCOSE, CAPILLARY
Glucose-Capillary: 25 mg/dL — CL (ref 70–99)
Glucose-Capillary: 61 mg/dL — ABNORMAL LOW (ref 70–99)
Glucose-Capillary: 92 mg/dL (ref 70–99)

## 2012-09-13 LAB — ETHANOL: Alcohol, Ethyl (B): 243 mg/dL — ABNORMAL HIGH (ref 0–11)

## 2012-09-13 LAB — URINE MICROSCOPIC-ADD ON

## 2012-09-13 NOTE — ED Notes (Signed)
Pt talking, pt able to follow commands, pt states that his left side hurts all the way down and his left wrist.
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
## 2012-09-13 NOTE — ED Notes (Addendum)
Pt reapplied to monitor, pt sister at bedside. Pt remains alert and oriented. Pt able to stand at bedside to give urine sample. Pt CBG 61 told to EDP. Pt given sandwich and drink per EDP.

## 2012-09-14 ENCOUNTER — Encounter (HOSPITAL_COMMUNITY): Payer: Self-pay | Admitting: Emergency Medicine

## 2012-09-14 ENCOUNTER — Emergency Department (HOSPITAL_COMMUNITY): Payer: Self-pay

## 2012-09-14 ENCOUNTER — Inpatient Hospital Stay (HOSPITAL_COMMUNITY)
Admission: EM | Admit: 2012-09-14 | Discharge: 2012-09-16 | DRG: 640 | Disposition: A | Discharge status: Home / Self Care | Payer: Self-pay | Attending: Internal Medicine | Admitting: Internal Medicine

## 2012-09-14 ENCOUNTER — Inpatient Hospital Stay (HOSPITAL_COMMUNITY): Payer: Self-pay

## 2012-09-14 DIAGNOSIS — D72829 Elevated white blood cell count, unspecified: Secondary | ICD-10-CM | POA: Diagnosis present

## 2012-09-14 DIAGNOSIS — F3289 Other specified depressive episodes: Secondary | ICD-10-CM | POA: Diagnosis present

## 2012-09-14 DIAGNOSIS — E876 Hypokalemia: Secondary | ICD-10-CM | POA: Diagnosis present

## 2012-09-14 DIAGNOSIS — S2239XA Fracture of one rib, unspecified side, initial encounter for closed fracture: Secondary | ICD-10-CM | POA: Diagnosis present

## 2012-09-14 DIAGNOSIS — F329 Major depressive disorder, single episode, unspecified: Secondary | ICD-10-CM | POA: Diagnosis present

## 2012-09-14 DIAGNOSIS — W19XXXA Unspecified fall, initial encounter: Secondary | ICD-10-CM | POA: Diagnosis present

## 2012-09-14 DIAGNOSIS — F419 Anxiety disorder, unspecified: Secondary | ICD-10-CM | POA: Diagnosis present

## 2012-09-14 DIAGNOSIS — R569 Unspecified convulsions: Secondary | ICD-10-CM | POA: Diagnosis present

## 2012-09-14 DIAGNOSIS — Y92009 Unspecified place in unspecified non-institutional (private) residence as the place of occurrence of the external cause: Secondary | ICD-10-CM

## 2012-09-14 DIAGNOSIS — G9340 Encephalopathy, unspecified: Secondary | ICD-10-CM | POA: Diagnosis present

## 2012-09-14 DIAGNOSIS — F32A Depression, unspecified: Secondary | ICD-10-CM | POA: Diagnosis present

## 2012-09-14 DIAGNOSIS — E162 Hypoglycemia, unspecified: Principal | ICD-10-CM | POA: Diagnosis present

## 2012-09-14 DIAGNOSIS — Z23 Encounter for immunization: Secondary | ICD-10-CM

## 2012-09-14 DIAGNOSIS — F1020 Alcohol dependence, uncomplicated: Secondary | ICD-10-CM | POA: Diagnosis present

## 2012-09-14 DIAGNOSIS — F1010 Alcohol abuse, uncomplicated: Secondary | ICD-10-CM

## 2012-09-14 DIAGNOSIS — F411 Generalized anxiety disorder: Secondary | ICD-10-CM | POA: Diagnosis present

## 2012-09-14 HISTORY — DX: Anxiety disorder, unspecified: F41.9

## 2012-09-14 HISTORY — DX: Major depressive disorder, single episode, unspecified: F32.9

## 2012-09-14 HISTORY — DX: Alcohol dependence, uncomplicated: F10.20

## 2012-09-14 HISTORY — DX: Encephalopathy, unspecified: G93.40

## 2012-09-14 HISTORY — DX: Depression, unspecified: F32.A

## 2012-09-14 LAB — RAPID URINE DRUG SCREEN, HOSP PERFORMED
Barbiturates: NOT DETECTED
Benzodiazepines: NOT DETECTED
Cocaine: NOT DETECTED
Opiates: NOT DETECTED

## 2012-09-14 LAB — GLUCOSE, CAPILLARY
Glucose-Capillary: 30 mg/dL — CL (ref 70–99)
Glucose-Capillary: 84 mg/dL (ref 70–99)
Glucose-Capillary: 122 mg/dL — ABNORMAL HIGH (ref 70–99)
Glucose-Capillary: 160 mg/dL — ABNORMAL HIGH (ref 70–99)

## 2012-09-14 LAB — HEMOGLOBIN A1C: Mean Plasma Glucose: 117 mg/dL — ABNORMAL HIGH (ref <117)

## 2012-09-14 LAB — URINALYSIS, ROUTINE W REFLEX MICROSCOPIC
Glucose, UA: 100 mg/dL — AB
Ketones, ur: NEGATIVE mg/dL
Leukocytes, UA: NEGATIVE
Nitrite: NEGATIVE
Specific Gravity, Urine: 1.013 (ref 1.005–1.030)
pH: 6 (ref 5.0–8.0)

## 2012-09-14 LAB — COMPREHENSIVE METABOLIC PANEL
ALT: 35 U/L (ref 0–53)
AST: 90 U/L — ABNORMAL HIGH (ref 0–37)
Calcium: 8.4 mg/dL (ref 8.4–10.5)
Creatinine, Ser: 0.75 mg/dL (ref 0.50–1.35)
GFR calc Af Amer: >90 mL/min (ref >90)
Sodium: 137 mEq/L (ref 135–145)
Total Protein: 7.3 g/dL (ref 6.0–8.3)

## 2012-09-14 LAB — CBC WITH DIFFERENTIAL/PLATELET
Basophils Absolute: 0 10*3/uL (ref 0.0–0.1)
Eosinophils Absolute: 0.1 10*3/uL (ref 0.0–0.7)
Eosinophils Relative: 1 % (ref 0–5)
MCH: 30.3 pg (ref 26.0–34.0)
MCHC: 36.1 g/dL — ABNORMAL HIGH (ref 30.0–36.0)
MCV: 83.9 fL (ref 78.0–100.0)
Platelets: 159 10*3/uL (ref 150–400)
RDW: 13 % (ref 11.5–15.5)

## 2012-09-14 LAB — PROTIME-INR: Prothrombin Time: 12.9 seconds (ref 11.6–15.2)

## 2012-09-14 LAB — MRSA PCR SCREENING: MRSA by PCR: NEGATIVE

## 2012-09-14 LAB — PHOSPHORUS: Phosphorus: 1.9 mg/dL — ABNORMAL LOW (ref 2.3–4.6)

## 2012-09-14 MED ORDER — SODIUM CHLORIDE 0.9 % IV SOLN
INTRAVENOUS | Status: DC | PRN
  Administered 2012-09-14: 10 mL/h via INTRAVENOUS

## 2012-09-14 MED ORDER — FLEET ENEMA 7-19 GM/118ML RE ENEM: 1.0000 | ENEMA | Freq: Once | RECTAL | Status: AC | PRN

## 2012-09-14 MED ORDER — BISACODYL 5 MG PO TBEC: 5.0000 mg | DELAYED_RELEASE_TABLET | Freq: Every day | ORAL | Status: DC | PRN

## 2012-09-14 MED ORDER — ACETAMINOPHEN 650 MG RE SUPP: 650.0000 mg | Freq: Four times a day (QID) | RECTAL | Status: DC | PRN

## 2012-09-14 MED ORDER — FOLIC ACID 1 MG PO TABS
1.0000 mg | ORAL_TABLET | Freq: Every day | ORAL | Status: DC
  Administered 2012-09-14 – 2012-09-16 (×3): 1 mg via ORAL
  Filled 2012-09-14 (×3): qty 1

## 2012-09-14 MED ORDER — POTASSIUM CHLORIDE CRYS ER 20 MEQ PO TBCR
40.0000 meq | EXTENDED_RELEASE_TABLET | Freq: Once | ORAL | Status: AC
  Administered 2012-09-14: 40 meq via ORAL
  Filled 2012-09-14 (×2): qty 1

## 2012-09-14 MED ORDER — LORAZEPAM 2 MG/ML IJ SOLN
1.0000 mg | Freq: Four times a day (QID) | INTRAMUSCULAR | Status: DC | PRN
  Filled 2012-09-14: qty 1

## 2012-09-14 MED ORDER — ACETAMINOPHEN 325 MG PO TABS: 650.0000 mg | ORAL_TABLET | Freq: Four times a day (QID) | ORAL | Status: DC | PRN

## 2012-09-14 MED ORDER — POLYETHYLENE GLYCOL 3350 17 G PO PACK
17.0000 g | PACK | Freq: Every day | ORAL | Status: DC | PRN
  Filled 2012-09-14: qty 1

## 2012-09-14 MED ORDER — VITAMIN B-1 100 MG PO TABS
100.0000 mg | ORAL_TABLET | Freq: Every day | ORAL | Status: DC
  Administered 2012-09-14 – 2012-09-16 (×3): 100 mg via ORAL
  Filled 2012-09-14 (×3): qty 1

## 2012-09-14 MED ORDER — ALUM & MAG HYDROXIDE-SIMETH 200-200-20 MG/5ML PO SUSP: 30.0000 mL | Freq: Four times a day (QID) | ORAL | Status: DC | PRN

## 2012-09-14 MED ORDER — DEXTROSE-NACL 5-0.45 % IV SOLN
INTRAVENOUS | Status: DC
  Administered 2012-09-14: 125 mL/h via INTRAVENOUS
  Administered 2012-09-15: 20:00:00 via INTRAVENOUS
  Administered 2012-09-15: 125 mL/h via INTRAVENOUS
  Administered 2012-09-16: 04:00:00 via INTRAVENOUS

## 2012-09-14 MED ORDER — LORAZEPAM 1 MG PO TABS: 1.0000 mg | ORAL_TABLET | Freq: Four times a day (QID) | ORAL | Status: DC | PRN

## 2012-09-14 MED ORDER — ONDANSETRON HCL 4 MG/2ML IJ SOLN: 4.0000 mg | Freq: Four times a day (QID) | INTRAMUSCULAR | Status: DC | PRN

## 2012-09-14 MED ORDER — DEXTROSE 50 % IV SOLN: 12.5000 g | Freq: Once | INTRAVENOUS | Status: DC

## 2012-09-14 MED ORDER — LORAZEPAM 2 MG/ML IJ SOLN
1.0000 mg | Freq: Once | INTRAMUSCULAR | Status: AC
  Administered 2012-09-14: 1 mg via INTRAVENOUS
  Filled 2012-09-14: qty 1

## 2012-09-14 MED ORDER — PNEUMOCOCCAL VAC POLYVALENT 25 MCG/0.5ML IJ INJ
0.5000 mL | INJECTION | INTRAMUSCULAR | Status: AC
  Administered 2012-09-15: 0.5 mL via INTRAMUSCULAR
  Filled 2012-09-14 (×2): qty 0.5

## 2012-09-14 MED ORDER — DEXTROSE 50 % IV SOLN
25.0000 mL | Freq: Once | INTRAVENOUS | Status: AC
  Administered 2012-09-14: 25 mL via INTRAVENOUS
  Filled 2012-09-14: qty 50

## 2012-09-14 MED ORDER — SODIUM CHLORIDE 0.9 % IJ SOLN
3.0000 mL | Freq: Two times a day (BID) | INTRAMUSCULAR | Status: DC
  Administered 2012-09-15: 3 mL via INTRAVENOUS

## 2012-09-14 MED ORDER — INFLUENZA VIRUS VACC SPLIT PF IM SUSP
0.5000 mL | INTRAMUSCULAR | Status: AC
  Administered 2012-09-15: 0.5 mL via INTRAMUSCULAR
  Filled 2012-09-14 (×2): qty 0.5

## 2012-09-14 MED ORDER — MORPHINE SULFATE 2 MG/ML IJ SOLN: 1.0000 mg | INTRAMUSCULAR | Status: DC | PRN

## 2012-09-14 MED ORDER — K PHOS MONO-SOD PHOS DI & MONO 155-852-130 MG PO TABS
500.0000 mg | ORAL_TABLET | Freq: Two times a day (BID) | ORAL | Status: DC
  Administered 2012-09-14 – 2012-09-16 (×3): 500 mg via ORAL
  Filled 2012-09-14 (×5): qty 2

## 2012-09-14 MED ORDER — LORAZEPAM 2 MG/ML IJ SOLN: 0.0000 mg | Freq: Two times a day (BID) | INTRAMUSCULAR | Status: DC

## 2012-09-14 MED ORDER — POTASSIUM CHLORIDE CRYS ER 20 MEQ PO TBCR
40.0000 meq | EXTENDED_RELEASE_TABLET | Freq: Once | ORAL | Status: AC
  Administered 2012-09-14: 40 meq via ORAL
  Filled 2012-09-14: qty 2

## 2012-09-14 MED ORDER — ADULT MULTIVITAMIN W/MINERALS CH
1.0000 | ORAL_TABLET | Freq: Once | ORAL | Status: AC
  Administered 2012-09-14: 1 via ORAL
  Filled 2012-09-14: qty 1

## 2012-09-14 MED ORDER — DEXTROSE 50 % IV SOLN
50.0000 mL | Freq: Once | INTRAVENOUS | Status: AC
  Administered 2012-09-14: 50 mL via INTRAVENOUS
  Filled 2012-09-14: qty 50

## 2012-09-14 MED ORDER — ONDANSETRON HCL 4 MG PO TABS: 4.0000 mg | ORAL_TABLET | Freq: Four times a day (QID) | ORAL | Status: DC | PRN

## 2012-09-14 MED ORDER — THIAMINE HCL 100 MG/ML IJ SOLN
Freq: Once | INTRAVENOUS | Status: AC
  Administered 2012-09-14: 08:00:00 via INTRAVENOUS
  Filled 2012-09-14: qty 1000

## 2012-09-14 MED ORDER — SODIUM CHLORIDE 4 MEQ/ML IV SOLN
INTRAVENOUS | Status: DC
  Administered 2012-09-14: 05:00:00 via INTRAVENOUS
  Filled 2012-09-14 (×2): qty 1000

## 2012-09-14 MED ORDER — ADULT MULTIVITAMIN W/MINERALS CH
1.0000 | ORAL_TABLET | Freq: Every day | ORAL | Status: DC
  Administered 2012-09-15 – 2012-09-16 (×2): 1 via ORAL
  Filled 2012-09-14 (×2): qty 1

## 2012-09-14 MED ORDER — OXYCODONE HCL 5 MG PO TABS
5.0000 mg | ORAL_TABLET | ORAL | Status: DC | PRN
  Administered 2012-09-15: 5 mg via ORAL
  Filled 2012-09-14: qty 1

## 2012-09-14 MED ORDER — THIAMINE HCL 100 MG/ML IJ SOLN
100.0000 mg | Freq: Every day | INTRAMUSCULAR | Status: DC
  Filled 2012-09-14 (×2): qty 1

## 2012-09-14 MED ORDER — LORAZEPAM 2 MG/ML IJ SOLN: 1.0000 mg | INTRAMUSCULAR | Status: DC | PRN

## 2012-09-14 MED ORDER — DOCUSATE SODIUM 100 MG PO CAPS
100.0000 mg | ORAL_CAPSULE | Freq: Two times a day (BID) | ORAL | Status: DC
  Administered 2012-09-14 – 2012-09-16 (×3): 100 mg via ORAL
  Filled 2012-09-14 (×6): qty 1

## 2012-09-14 MED ORDER — LORAZEPAM 2 MG/ML IJ SOLN
0.0000 mg | Freq: Four times a day (QID) | INTRAMUSCULAR | Status: AC
  Administered 2012-09-14: 1 mg via INTRAVENOUS

## 2012-09-14 MED ORDER — MAGNESIUM SULFATE 50 % IJ SOLN
3.0000 g | Freq: Once | INTRAVENOUS | Status: AC
  Administered 2012-09-14: 3 g via INTRAVENOUS
  Filled 2012-09-14: qty 6

## 2012-09-14 MED ORDER — PANTOPRAZOLE SODIUM 40 MG PO TBEC
40.0000 mg | DELAYED_RELEASE_TABLET | Freq: Every day | ORAL | Status: DC
  Administered 2012-09-14 – 2012-09-16 (×3): 40 mg via ORAL
  Filled 2012-09-14 (×5): qty 1

## 2012-09-14 NOTE — Clinical Social Work Note (Signed)
CSW reviewed chart and met with Pt briefly due to referral for ETOH use. Pt reports to live with his sister and recently restarted drinking. CSW will complete full assessment in the am. Pt still getting settled in ICU and is aware CSW will further discuss ETOH use in the am.   Doreen Salvage, LCSW ICU/Stepdown Clinical Social Worker [facility] Cell [phone] Hours 8am-1200pm M-F

## 2012-09-14 NOTE — ED Notes (Signed)
ZOX:[id number]<AV> Expected date:09/14/12<BR> Expected time: 4:21 AM<BR> Means of arrival:Ambulance<BR> Comments:<BR> Hypoglycemic
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
## 2012-09-14 NOTE — Progress Notes (Signed)
CARE MANAGEMENT NOTE 09/14/2012  Patient:  [name],[name]   Account Number:  [id number]  Date Initiated:  09/14/2012  Documentation initiated by:  DAVIS,RHONDA  Subjective/Objective Assessment:   etoh history , seizures, hypoglycemia     Action/Plan:   home   Anticipated DC Date:  09/17/2012   Anticipated DC Plan:  HOME/SELF CARE         Choice offered to / List presented to:             Status of service:  In process, will continue to follow Medicare Important Message given?  NA - LOS <3 / Initial given by admissions (If response is "NO", the following Medicare IM given date fields will be blank) Date Medicare IM given:   Date Additional Medicare IM given:    Discharge Disposition:    Per UR Regulation:  Reviewed for med. necessity/level of care/duration of stay  If discussed at Long Length of Stay Meetings, dates discussed:    Comments:  Anola Gurney

## 2012-09-14 NOTE — ED Notes (Signed)
V/O from Hospitalist to let patient finish current dextrose infusion

## 2012-09-14 NOTE — ED Notes (Signed)
Patient transported to MRI 

## 2012-09-14 NOTE — ED Notes (Signed)
MD at bedside. 
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
## 2012-09-14 NOTE — ED Notes (Signed)
Accepting nurse requesting that patient go to MRI before he is transported to room

## 2012-09-14 NOTE — ED Notes (Signed)
Brought in by EMS from home after family witnessed pt's "seizure-like" activity.  Per EMS, pt was just discharged at around 1:30AM from here--- pt has been "drinking" ever since he got home; pt was lying on bathroom floor on EMS' arrival-- pt's CBG was 20 when checked by EMS; pt was given D50 37.5 grams IV and pt' CBG went up to 120; pt arrived to ED room A/Ox3 with c/o "very cold"--- pt was actively shivering on arrival.

## 2012-09-14 NOTE — ED Notes (Signed)
MRI to pick patient up in 15 minutes

## 2012-09-15 ENCOUNTER — Inpatient Hospital Stay (HOSPITAL_COMMUNITY)
Admit: 2012-09-15 | Discharge: 2012-09-15 | Disposition: A | Discharge status: Home / Self Care | Payer: PRIVATE HEALTH INSURANCE | Attending: Internal Medicine | Admitting: Internal Medicine

## 2012-09-15 DIAGNOSIS — F1020 Alcohol dependence, uncomplicated: Secondary | ICD-10-CM

## 2012-09-15 LAB — COMPREHENSIVE METABOLIC PANEL
ALT: 33 U/L (ref 0–53)
Albumin: 2.7 g/dL — ABNORMAL LOW (ref 3.5–5.2)
Alkaline Phosphatase: 86 U/L (ref 39–117)
Calcium: 8.6 mg/dL (ref 8.4–10.5)
GFR calc Af Amer: >90 mL/min (ref >90)
Glucose, Bld: 137 mg/dL — ABNORMAL HIGH (ref 70–99)
Potassium: 3.7 mEq/L (ref 3.5–5.1)
Sodium: 135 mEq/L (ref 135–145)
Total Protein: 6.7 g/dL (ref 6.0–8.3)

## 2012-09-15 LAB — GLUCOSE, CAPILLARY
Glucose-Capillary: 139 mg/dL — ABNORMAL HIGH (ref 70–99)
Glucose-Capillary: 123 mg/dL — ABNORMAL HIGH (ref 70–99)
Glucose-Capillary: 108 mg/dL — ABNORMAL HIGH (ref 70–99)

## 2012-09-15 LAB — CBC
HCT: 35.7 % — ABNORMAL LOW (ref 39.0–52.0)
Hemoglobin: 12.5 g/dL — ABNORMAL LOW (ref 13.0–17.0)
MCHC: 35 g/dL (ref 30.0–36.0)
MCV: 84.6 fL (ref 78.0–100.0)
RDW: 12.9 % (ref 11.5–15.5)
WBC: 9.7 10*3/uL (ref 4.0–10.5)

## 2012-09-15 LAB — URINE CULTURE: Colony Count: 40000

## 2012-09-15 LAB — INSULIN-LIKE GROWTH FACTOR: Somatomedin C: 87 ng/mL (ref 55–213)

## 2012-09-15 LAB — PHOSPHORUS: Phosphorus: 2.9 mg/dL (ref 2.3–4.6)

## 2012-09-15 MED ORDER — HYDRALAZINE HCL 25 MG PO TABS
25.0000 mg | ORAL_TABLET | Freq: Three times a day (TID) | ORAL | Status: DC
  Administered 2012-09-15 – 2012-09-16 (×3): 25 mg via ORAL
  Filled 2012-09-15 (×5): qty 1

## 2012-09-15 NOTE — Evaluation (Signed)
Occupational Therapy Evaluation Patient Details Name: [name] MRN: [id number] DOB: [birth year] Today's Date: 09/15/2012 Time: 4098-1191 OT Time Calculation (min): 27 min  OT Assessment / Plan / Recommendation Clinical Impression  Pt. is 57 y/o male admitted after sister found pt. down and having seizure-like symptoms. Pt. found to be hypoglycemic. Pt. has h/o ETOH. Pt does have a hx of gout. Skilled OT indicated to maximize independence with BADLs to mod I level in prep for safe d/c home.    OT Assessment  Patient needs continued OT Services    Follow Up Recommendations  No OT follow up    Barriers to Discharge      Equipment Recommendations  None recommended by OT    Recommendations for Other Services    Frequency  Min 2X/week    Precautions / Restrictions Precautions Precautions: Fall Precaution Comments: r/o seizure.   Pertinent Vitals/Pain Pt reported gout like pain in R great toe which he did not rate. Repositioned for comfort.    ADL  Grooming: Wash/dry hands;Wash/dry face;Teeth care;Min guard Where Assessed - Grooming: Unsupported standing Upper Body Bathing: Set up Where Assessed - Upper Body Bathing: Unsupported sitting Lower Body Bathing: Set up Where Assessed - Lower Body Bathing: Supported sit to stand Upper Body Dressing: Set up Where Assessed - Upper Body Dressing: Unsupported sitting Lower Body Dressing: Set up Where Assessed - Lower Body Dressing: Supported sit to stand Toilet Transfer: Supervision/safety Statistician Method: Sit to Barista: Raised toilet seat with arms (or 3-in-1 over toilet) Toileting - Clothing Manipulation and Hygiene: Set up Where Assessed - Engineer, mining and Hygiene: Sit to stand from 3-in-1 or toilet Equipment Used: Rolling walker Transfers/Ambulation Related to ADLs: Pt ambulated to the bathroom with minguard A using IV pole. Pt then ambulated with RW 2* gout attack in R great  toe. ADL Comments: Pt was able to complete entire spongebath without physical A.    OT Diagnosis: Generalized weakness  OT Problem List: Decreased activity tolerance;Decreased knowledge of use of DME or AE OT Treatment Interventions: Self-care/ADL training;Therapeutic activities;DME and/or AE instruction;Patient/family education   OT Goals Acute Rehab OT Goals OT Goal Formulation: With patient Time For Goal Achievement: 09/29/12 Potential to Achieve Goals: Good ADL Goals Pt Will Perform Grooming: with modified independence;Standing at sink ADL Goal: Grooming - Progress: Goal set today Pt Will Transfer to Toilet: with modified independence;Ambulation;with DME ADL Goal: Toilet Transfer - Progress: Goal set today Pt Will Perform Toileting - Clothing Manipulation: with modified independence;Sitting on 3-in-1 or toilet;Standing ADL Goal: Toileting - Clothing Manipulation - Progress: Goal set today Pt Will Perform Toileting - Hygiene: with modified independence;Sit to stand from 3-in-1/toilet ADL Goal: Toileting - Hygiene - Progress: Goal set today Pt Will Perform Tub/Shower Transfer: with modified independence;Ambulation;Tub transfer ADL Goal: Tub/Shower Transfer - Progress: Goal set today Additional ADL Goal #1: Pt will complete all aspects of bathing and dressing including item retrieval at mod I level. ADL Goal: Additional Goal #1 - Progress: Goal set today  Visit Information  Last OT Received On: 09/15/12 Assistance Needed: +1 PT/OT Co-Evaluation/Treatment: Yes    Subjective Data  Subjective: It feels good to get up and walk around. Patient Stated Goal: Not asked.   Prior Functioning     Home Living Lives With: Family Type of Home: House Home Access: Stairs to enter Secretary/administrator of Steps: 4 Entrance Stairs-Rails: Can reach both Home Layout: One level Bathroom Shower/Tub: Engineer, manufacturing systems: Standard Home Adaptive Equipment:  None Prior  Function Level of Independence: Independent Able to Take Stairs?: Yes Driving: Yes Vocation: Part time employment Communication Communication: No difficulties Dominant Hand: Right         Vision/Perception     Cognition  Cognition Overall Cognitive Status: Appears within functional limits for tasks assessed/performed Arousal/Alertness: Awake/alert Orientation Level: Appears intact for tasks assessed Behavior During Session: [facility] for tasks performed    Extremity/Trunk Assessment Right Upper Extremity Assessment RUE ROM/Strength/Tone: [facility] for tasks assessed Left Upper Extremity Assessment LUE ROM/Strength/Tone: WFL for tasks assessed Right Lower Extremity Assessment RLE ROM/Strength/Tone: WFL for tasks assessed RLE Sensation: WFL - Light Touch Left Lower Extremity Assessment LLE ROM/Strength/Tone: WFL for tasks assessed LLE Sensation: WFL - Light Touch     Mobility Bed Mobility Bed Mobility: Supine to Sit Supine to Sit: 7: Independent Transfers Sit to Stand: 4: Min guard;From bed;From toilet;With upper extremity assist Stand to Sit: 5: Supervision;With armrests;To toilet;To chair/3-in-1 Details for Transfer Assistance: pt mildly shakey upon first standing, keeping weight off of L gr.toe     Exercise     Balance Balance Balance Assessed: Yes Dynamic Standing Balance Dynamic Standing - Balance Support: During functional activity;No upper extremity supported Dynamic Standing - Level of Assistance: 5: Stand by assistance;4: Min assist   End of Session OT - End of Session Activity Tolerance: Patient tolerated treatment well Patient left: in chair;with call bell/phone within reach Nurse Communication: Mobility status  GO     Aleayah Chico A OTR/L [phone] 09/15/2012, 9:37 AM
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
## 2012-09-15 NOTE — Evaluation (Signed)
Physical Therapy Evaluation Patient Details Name: [name] MRN: [id number] DOB: 11-Feb-1956 Today's Date: 09/15/2012 Time: 7829-5621 PT Time Calculation (min): 25 min  PT Assessment / Plan / Recommendation Clinical Impression  Pt. is 57 y/o male admitted after sister found pt. down and having seizure-like symptoms. Pt. found to be hypoglycemic. Pt. has h/o ETOH. Pt. lives with sister. Pt. was functional today but having pain in L gr. toe and antalgic. Pt. reports h/o gout. Pt. will benefit from PT to return to independence.     PT Assessment  Patient needs continued PT services    Follow Up Recommendations  No PT follow up    Does the patient have the potential to tolerate intense rehabilitation      Barriers to Discharge        Equipment Recommendations  None recommended by PT;Cane (possibly a cane if gout symptoms persist.)    Recommendations for Other Services     Frequency Min 3X/week    Precautions / Restrictions Precautions Precautions: Fall Precaution Comments: r/o seizure.   Pertinent Vitals/Pain L gr. Toe is painful=5/10      Mobility  Bed Mobility Bed Mobility: Supine to Sit Supine to Sit: 7: Independent Transfers Transfers: Sit to Stand;Stand to Sit Sit to Stand: 4: Min guard;From bed;From toilet;With upper extremity assist Stand to Sit: 5: Supervision;With armrests;To toilet;To chair/3-in-1 Details for Transfer Assistance: pt mildly shakey upon first standing, keeping weight off of L gr.toe Ambulation/Gait Ambulation/Gait Assistance: 4: Min guard Ambulation Distance (Feet): 175 Feet Assistive device: Rolling walker Ambulation/Gait Assistance Details: decreased weight on forefoot due to pain of L gr. toe with possible gout. Gait Pattern: Step-through pattern;Decreased stance time - left;Decreased weight shift to left;Trunk flexed;Antalgic Gait velocity: Pt. is walking forward flexed and lurching during stance on LLE due to pain.speed is decreased     Exercises     PT Diagnosis: Difficulty walking;Acute pain  PT Problem List: Decreased strength;Pain;Decreased mobility PT Treatment Interventions: Gait training;Patient/family education;Functional mobility training   PT Goals Acute Rehab PT Goals PT Goal Formulation: With patient Time For Goal Achievement: 09/29/12 Potential to Achieve Goals: Good Pt will Ambulate: >150 feet;with modified independence;with least restrictive assistive device PT Goal: Ambulate - Progress: Goal set today  Visit Information  Last PT Received On: 09/15/12 Assistance Needed: +1    Subjective Data  Subjective: It feels good to be moving around. Patient Stated Goal: to go home   Prior Functioning  Home Living Lives With: Family Type of Home: House Home Access: Stairs to enter Secretary/administrator of Steps: 4 Entrance Stairs-Rails: Can reach both Home Layout: One level Bathroom Shower/Tub: Network engineer: None Prior Function Level of Independence: Independent Able to Take Stairs?: Yes Driving: Yes Vocation: Part time employment Communication Communication: No difficulties Dominant Hand: Right    Cognition  Cognition Overall Cognitive Status: Appears within functional limits for tasks assessed/performed Arousal/Alertness: Awake/alert Orientation Level: Appears intact for tasks assessed Behavior During Session: [facility] for tasks performed    Extremity/Trunk Assessment Right Lower Extremity Assessment RLE ROM/Strength/Tone: WFL for tasks assessed RLE Sensation: WFL - Light Touch Left Lower Extremity Assessment LLE ROM/Strength/Tone: WFL for tasks assessed LLE Sensation: WFL - Light Touch   Balance    End of Session PT - End of Session Activity Tolerance: Patient tolerated treatment well Patient left: in chair;with call bell/phone within reach Nurse Communication: Mobility status  GP     Rada Hay 09/15/2012, 9:31  AM  [phone]

## 2012-09-15 NOTE — Clinical Social Work Psychosocial (Signed)
Clinical Social Work Department BRIEF PSYCHOSOCIAL ASSESSMENT 09/15/2012  Patient:  [name], [name]     Account Number:  [id number]     Admit date:  09/14/2012  Clinical Social Worker:  Jodelle Red  Date/Time:  09/15/2012 12:17 PM  Referred by:  Physician  Date Referred:  09/14/2012 Referred for  Substance Abuse   Other Referral:   Interview type:  Patient Other interview type:   chart review, discussion with RN    PSYCHOSOCIAL DATA Living Status:  FAMILY Admitted from facility:   Level of care:   Primary support name:  Jannette Spanner Primary support relationship to patient:  SIBLING Degree of support available:   good    CURRENT CONCERNS Current Concerns  Substance Abuse   Other Concerns:    SOCIAL WORK ASSESSMENT / PLAN Pt reports to have h/o ETOH abuse. He states he stopped in the past and has been able to stay sober for about 6 months at a time. Pt currently works at [employer]. He has transportation and his sister is supportive of him. Pt denies wanting asst with his ETOH issues and feel he can stop on his own as he has in the past. CSW discussed local resources available to assist. Pt aware he needs to make some lifestyle changes. Pt to consider options further and agrees to CSW follow up.  Pt denies other concerns and has good resources/support.   Assessment/plan status:  Psychosocial Support/Ongoing Assessment of Needs Other assessment/ plan:   resources as appropriate   Information/referral to community resources:   reassess to see if Pt wants resources.    PATIENT'S/FAMILY'S RESPONSE TO PLAN OF CARE: Pt aware he needs to make some lifestyle changes. Pt to consider options further and agrees to CSW follow up and check in if he is open to further assistance.        Doreen Salvage, LCSW ICU/Stepdown Clinical Social Worker [facility] Cell [phone] Hours 8am-1200pm M-F

## 2012-09-15 NOTE — Progress Notes (Signed)
Offsite portable EEG competed at [employer].

## 2012-09-16 LAB — GLUCOSE, CAPILLARY: Glucose-Capillary: 142 mg/dL — ABNORMAL HIGH (ref 70–99)

## 2012-09-16 MED ORDER — HYDRALAZINE HCL 25 MG PO TABS: 25.0000 mg | ORAL_TABLET | Freq: Three times a day (TID) | ORAL | Status: DC

## 2012-09-16 MED ORDER — THIAMINE HCL 100 MG PO TABS: 100.0000 mg | ORAL_TABLET | Freq: Every day | ORAL | Status: DC

## 2012-09-16 MED ORDER — K PHOS MONO-SOD PHOS DI & MONO 155-852-130 MG PO TABS: 2.0000 | ORAL_TABLET | Freq: Two times a day (BID) | ORAL | Status: DC

## 2012-09-16 MED ORDER — OXYCODONE HCL 5 MG PO TABS: 5.0000 mg | ORAL_TABLET | ORAL | Status: DC | PRN

## 2012-09-16 MED ORDER — FOLIC ACID 1 MG PO TABS: 1.0000 mg | ORAL_TABLET | Freq: Every day | ORAL | Status: DC

## 2012-09-16 MED ORDER — BISACODYL 5 MG PO TBEC: 5.0000 mg | DELAYED_RELEASE_TABLET | Freq: Every day | ORAL | Status: DC | PRN

## 2012-09-16 MED ORDER — PANTOPRAZOLE SODIUM 40 MG PO TBEC: 40.0000 mg | DELAYED_RELEASE_TABLET | Freq: Every day | ORAL | Status: DC

## 2012-09-16 MED ORDER — DSS 100 MG PO CAPS: 100.0000 mg | ORAL_CAPSULE | Freq: Two times a day (BID) | ORAL | Status: DC

## 2012-09-16 MED ORDER — ADULT MULTIVITAMIN W/MINERALS CH: 1.0000 | ORAL_TABLET | Freq: Every day | ORAL | Status: DC
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
## 2012-09-16 NOTE — Procedures (Signed)
EEG NUMBER:  [id number]  REFERRING PHYSICIAN:  Ramiro Harvest, MD  INDICATION FOR STUDY:  A 57 year old man with a history of alcohol dependence, who presented with recurrent witnessed generalized seizures. The patient was also noted to be hypoglycemic at least on 1 occasion.  DESCRIPTION:  This is a routine EEG recording performed during wakefulness.  Predominant background activity consisted of diffuse low amplitude 0.5 to 1 Hz continuous symmetrical delta activity with superimposed 20-25 Hz low-amplitude theta activity diffusely as well. Occasional 10 Hz alpha rhythm was recorded from the posterior head regions.  Photic stimulation was not performed.  Hyperventilation was not performed.  No epileptiform discharges were recorded.  INTERPRETATION:  The EEG shows minimal generalized continuous nonspecific slowing of cerebral activity.  This pattern of slowing can be seen with metabolic as well as degenerative central nervous system disorders.  No evidence of an epileptic disorder was demonstrated.     [name] Christmas, MD    [id number] D:  09/15/2012 16:55:51  T:  09/16/2012 00:57:14  Job #:  [id number]

## 2012-09-16 NOTE — Progress Notes (Signed)
CSW received notification from ICU CSW that pt transferred to floor and was agreeable to Unit CSW following up with pt about local ETOH resources.  CSW met with pt at bedside to discuss. Pt continues to deny wanting assistance with ETOH issues. Pt continues to feel that he can stop ETOH on his own and aware that lifestyle changes are needed.  Pt is for discharge today and has transportation at discharge.   No further social work needs identified at this time.   CSW signing off.   Jacklynn Lewis, MSW, LCSWA  Clinical Social Work [phone]

## 2012-09-16 NOTE — Progress Notes (Signed)
PT Cancellation Note  Patient Details Name: [name] MRN: [id number] DOB: November 20, 1955   Cancelled Treatment:    Reason Eval/Treat Not Completed: Other (comment) (pt states he is walking fine and does not need PT) Pt declines needs for cane and states when he gets home he will get back to walking   Donnetta Hail 09/16/2012, 2:15 PM
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
## 2012-10-05 LAB — MISCELLANEOUS TEST

## 2012-10-25 ENCOUNTER — Emergency Department (HOSPITAL_COMMUNITY)
Admission: EM | Admit: 2012-10-25 | Discharge: 2012-10-25 | Disposition: A | Discharge status: Home / Self Care | Payer: Self-pay | Attending: Emergency Medicine | Admitting: Emergency Medicine

## 2012-10-25 ENCOUNTER — Encounter (HOSPITAL_COMMUNITY): Payer: Self-pay | Admitting: Family Medicine

## 2012-10-25 DIAGNOSIS — M79609 Pain in unspecified limb: Secondary | ICD-10-CM | POA: Insufficient documentation

## 2012-10-25 DIAGNOSIS — Z8659 Personal history of other mental and behavioral disorders: Secondary | ICD-10-CM | POA: Insufficient documentation

## 2012-10-25 DIAGNOSIS — Z8669 Personal history of other diseases of the nervous system and sense organs: Secondary | ICD-10-CM | POA: Insufficient documentation

## 2012-10-25 DIAGNOSIS — M109 Gout, unspecified: Secondary | ICD-10-CM | POA: Insufficient documentation

## 2012-10-25 MED ORDER — OXYCODONE-ACETAMINOPHEN 5-325 MG PO TABS: 2.0000 | ORAL_TABLET | ORAL | Status: DC | PRN

## 2012-10-25 MED ORDER — PREDNISONE 20 MG PO TABS: 60.0000 mg | ORAL_TABLET | Freq: Every day | ORAL | Status: AC

## 2012-10-25 NOTE — ED Provider Notes (Signed)
History    This chart was scribed for non-physician practitioner Elpidio Anis, PA-C working with Gilda Crease, MD by Gerlean Ren, ED Scribe. This patient was seen in room WTR5/WTR5 and the patient's care was started at 8:54 PM.    CSN: [id number]  Arrival date & time 10/25/12  1720   First MD Initiated Contact with Patient 10/25/12 2049      Chief Complaint  Patient presents with  . Gout     The history is provided by the patient. No language interpreter was used.  Ad Guttman is a 57 y.o. male who presents to the Emergency Department complaining of waxing-and-waning pain over the past 4 weeks in left leg, bilateral feet, and the right hand localized mostly to the PIP joints constant over the past week.  Pt states this is gout pain but denies that a medical professional has officially diagnosed him with gout.  Pt does not take any medications regularly and has only used OCM for pain with no relief to pain.     Past Medical History  Diagnosis Date  . Acute encephalopathy 09/14/2012  . Alcohol dependence 09/14/2012  . Depression 09/14/2012  . Anxiety 09/14/2012    History reviewed. No pertinent past surgical history.  No family history on file.  History  Substance Use Topics  . Smoking status: Never Smoker   . Smokeless tobacco: Never Used  . Alcohol Use: No     Comment: "Stopped a month ago"      Review of Systems  Musculoskeletal: Positive for joint swelling (right hand).  All other systems reviewed and are negative.    Allergies  Review of patient's allergies indicates no known allergies.  Home Medications  No current outpatient prescriptions on file.  BP 148/74  Pulse 96  Temp(Src) 98.7 F (37.1 C) (Oral)  SpO2 99%  Physical Exam  Nursing note and vitals reviewed. Constitutional: He is oriented to person, place, and time. He appears well-developed and well-nourished. No distress.  HENT:  Head: Normocephalic and atraumatic.  Eyes: EOM are normal.   Neck: Neck supple. No tracheal deviation present.  Cardiovascular: Normal rate.   Pulmonary/Chest: Effort normal. No respiratory distress.  Musculoskeletal: He exhibits edema.  Marked edema of right hand, no redness, no warmth, limited ROM, edema extends to the wrist without obvious involvement of the carpal joints.  Neurological: He is alert and oriented to person, place, and time.  Skin: Skin is warm and dry.  Psychiatric: He has a normal mood and affect. His behavior is normal.    ED Course  Procedures (including critical care time) DIAGNOSTIC STUDIES: Oxygen Saturation is 99% on room air, normal by my interpretation.    COORDINATION OF CARE: 8:58 PM- Patient informed of clinical course, understands medical decision-making process, and agrees with plan.  Labs Reviewed - No data to display No results found.   No diagnosis found.  1. Gout   MDM  Painful swelling involving the 1st MTP joints of feet by history, now affecting hand with similar symptoms previously. Will treat with pain medication, prednisone. I personally performed the services described in this documentation, which was scribed in my presence. The recorded information has been reviewed and is accurate.         Arnoldo Hooker, PA-C 10/25/12 2107

## 2012-10-25 NOTE — ED Provider Notes (Signed)
Medical screening examination/treatment/procedure(s) were performed by non-physician practitioner and as supervising physician I was immediately available for consultation/collaboration.  Ameliya Nicotra J. Claud Gowan, MD 10/25/12 2352 

## 2012-10-25 NOTE — ED Notes (Signed)
Patient states that he has had problem with gout off and on for the past 4 weeks. States attack has gone from his feet to his hands. States that right hand has been swollen for the past week. Has taken over-the-counter meds for pain and swelling without relief.

## 2012-11-06 ENCOUNTER — Encounter (HOSPITAL_COMMUNITY): Payer: Self-pay | Admitting: Emergency Medicine

## 2012-11-06 ENCOUNTER — Emergency Department (HOSPITAL_COMMUNITY)
Admission: EM | Admit: 2012-11-06 | Discharge: 2012-11-06 | Disposition: A | Discharge status: Home / Self Care | Payer: Self-pay | Attending: Emergency Medicine | Admitting: Emergency Medicine

## 2012-11-06 DIAGNOSIS — M7989 Other specified soft tissue disorders: Secondary | ICD-10-CM | POA: Insufficient documentation

## 2012-11-06 DIAGNOSIS — Z8659 Personal history of other mental and behavioral disorders: Secondary | ICD-10-CM | POA: Insufficient documentation

## 2012-11-06 DIAGNOSIS — M109 Gout, unspecified: Secondary | ICD-10-CM | POA: Insufficient documentation

## 2012-11-06 DIAGNOSIS — Z8669 Personal history of other diseases of the nervous system and sense organs: Secondary | ICD-10-CM | POA: Insufficient documentation

## 2012-11-06 DIAGNOSIS — M25539 Pain in unspecified wrist: Secondary | ICD-10-CM | POA: Insufficient documentation

## 2012-11-06 MED ORDER — INDOMETHACIN 50 MG PO CAPS
50.0000 mg | ORAL_CAPSULE | Freq: Once | ORAL | Status: AC
  Administered 2012-11-06: 50 mg via ORAL
  Filled 2012-11-06: qty 1

## 2012-11-06 MED ORDER — INDOMETHACIN 25 MG PO CAPS: 25.0000 mg | ORAL_CAPSULE | Freq: Three times a day (TID) | ORAL | Status: DC | PRN

## 2012-11-06 MED ORDER — PREDNISONE 10 MG PO TABS: 40.0000 mg | ORAL_TABLET | Freq: Every day | ORAL | Status: DC

## 2012-11-06 MED ORDER — OXYCODONE-ACETAMINOPHEN 5-325 MG PO TABS: 1.0000 | ORAL_TABLET | Freq: Four times a day (QID) | ORAL | Status: DC | PRN

## 2012-11-06 NOTE — ED Notes (Signed)
Pt complains of pain and swelling to right hand x 3 weeks.

## 2012-11-06 NOTE — ED Provider Notes (Signed)
History    This chart was scribed for non-physician practitioner, Marlon Pel PA-C working with Rolan Bucco, MD by Smitty Pluck, ED scribe. This patient was seen in room WTR6/WTR6 and the patient's care was started at 5:15 PM.   CSN: [id number]  Arrival date & time 11/06/12  1630      Chief Complaint  Patient presents with  . Gout     The history is provided by the patient. No language interpreter was used.   [name] is a 57 y.o. male w/ h/o gout who presents to the Emergency Department complaining of right wrist pain and right hand swelling onset 3 weeks ago due to gout. He reports that he normally has gout in bilateral feet but this is the first time that it has affected his hand. He states that he was seen in [facility] hospital for same symptoms shortly after onset and given pain medication and prednisone that provided relief but symptoms returned. Pt denies injury to right hand, getting bitten,  fever, chills, nausea, vomiting, diarrhea, weakness, cough, SOB and any other pain. He states that he stopped drinking alcohol 1 month ago.    Pt does not have PCP.   Past Medical History  Diagnosis Date  . Acute encephalopathy 09/14/2012  . Alcohol dependence 09/14/2012  . Depression 09/14/2012  . Anxiety 09/14/2012    History reviewed. No pertinent past surgical history.  No family history on file.  History  Substance Use Topics  . Smoking status: Never Smoker   . Smokeless tobacco: Never Used  . Alcohol Use: No     Comment: "Stopped a month ago"      Review of Systems  Constitutional: Negative for fever and chills.  Respiratory: Negative for shortness of breath.   Gastrointestinal: Negative for nausea and vomiting.  Musculoskeletal: Positive for arthralgias.  Neurological: Negative for weakness.  All other systems reviewed and are negative.    Allergies  Review of patient's allergies indicates no known allergies.  Home Medications   Current Outpatient Rx  Name   Route  Sig  Dispense  Refill  . naproxen sodium (ANAPROX) 220 MG tablet   Oral   Take 220 mg by mouth 2 (two) times daily with a meal.         . indomethacin (INDOCIN) 25 MG capsule   Oral   Take 1 capsule (25 mg total) by mouth 3 (three) times daily as needed.   30 capsule   0   . oxyCODONE-acetaminophen (PERCOCET/ROXICET) 5-325 MG per tablet   Oral   Take 2 tablets by mouth every 4 (four) hours as needed for pain.   6 tablet   0   . oxyCODONE-acetaminophen (PERCOCET/ROXICET) 5-325 MG per tablet   Oral   Take 1 tablet by mouth every 6 (six) hours as needed for pain.   12 tablet   0   . predniSONE (DELTASONE) 10 MG tablet   Oral   Take 4 tablets (40 mg total) by mouth daily.   28 tablet   0     BP 134/65  Pulse 80  Temp(Src) 97.9 F (36.6 C)  Resp 20  SpO2 100%  Physical Exam  Nursing note and vitals reviewed. Constitutional: He is oriented to person, place, and time. He appears well-developed and well-nourished. No distress.  HENT:  Head: Normocephalic and atraumatic.  Eyes: EOM are normal.  Neck: Neck supple. No tracheal deviation present.  Cardiovascular: Normal rate.   Pulmonary/Chest: Effort normal. No respiratory distress.  Musculoskeletal:       Right wrist: He exhibits decreased range of motion, tenderness, bony tenderness and swelling. He exhibits no effusion, no crepitus, no deformity and no laceration.  Neurological: He is alert and oriented to person, place, and time.  Skin: Skin is warm and dry.  Psychiatric: He has a normal mood and affect. His behavior is normal.    ED Course  Procedures (including critical care time) DIAGNOSTIC STUDIES: Oxygen Saturation is 100% on room air, normal by my interpretation.    COORDINATION OF CARE: 5:19 PM Discussed ED treatment with pt and pt agrees to pain medication and prednisone.     Labs Reviewed - No data to display No results found.   1. Gout attack       MDM  Pt given a social work  referral. Indomethacin Prednisone And pain medication. Resources guide.   Discussed how he needs to get a primary care doctor to be placed on medication for his frequent gout attacks.   Pt has been advised of the symptoms that warrant their return to the ED. Patient has voiced understanding and has agreed to follow-up with the PCP or specialist.  I personally performed the services described in this documentation, which was scribed in my presence. The recorded information has been reviewed and is accurate.   Dorthula Matas, PA-C 11/06/12 1759

## 2012-11-06 NOTE — ED Provider Notes (Signed)
Medical screening examination/treatment/procedure(s) were performed by non-physician practitioner and as supervising physician I was immediately available for consultation/collaboration.   Manley Fason, MD 11/06/12 2240 

## 2012-11-06 NOTE — ED Notes (Signed)
MD at bedside. 

## 2014-08-29 IMAGING — CR DG CHEST 1V PORT
1 series · 2 of 2 positions shown · non-contrast
Comparison: 01/02/2011

CLINICAL DATA: Left lateral chest pain.  Seizures.

PORTABLE CHEST - 1 VIEW

[Series 1: AP · U · 2 of 2 slices shown]
[im 1/2]
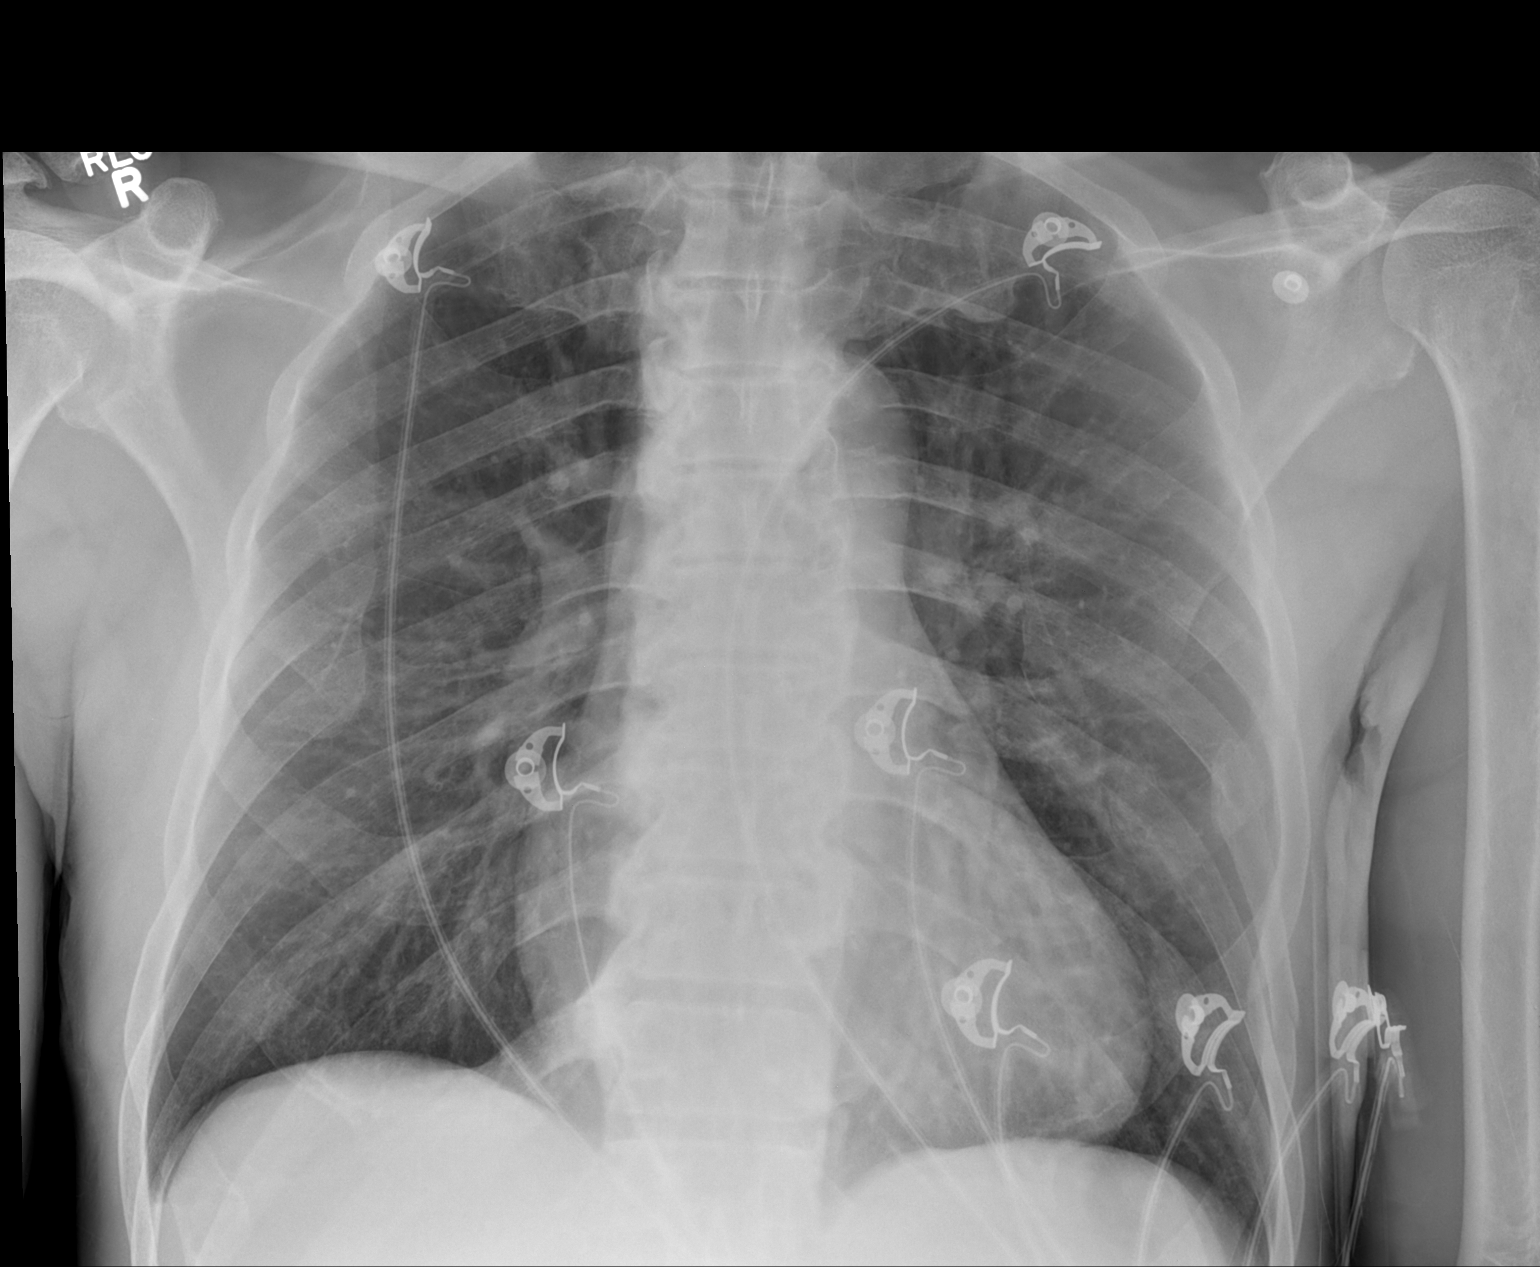
[im 2/2]
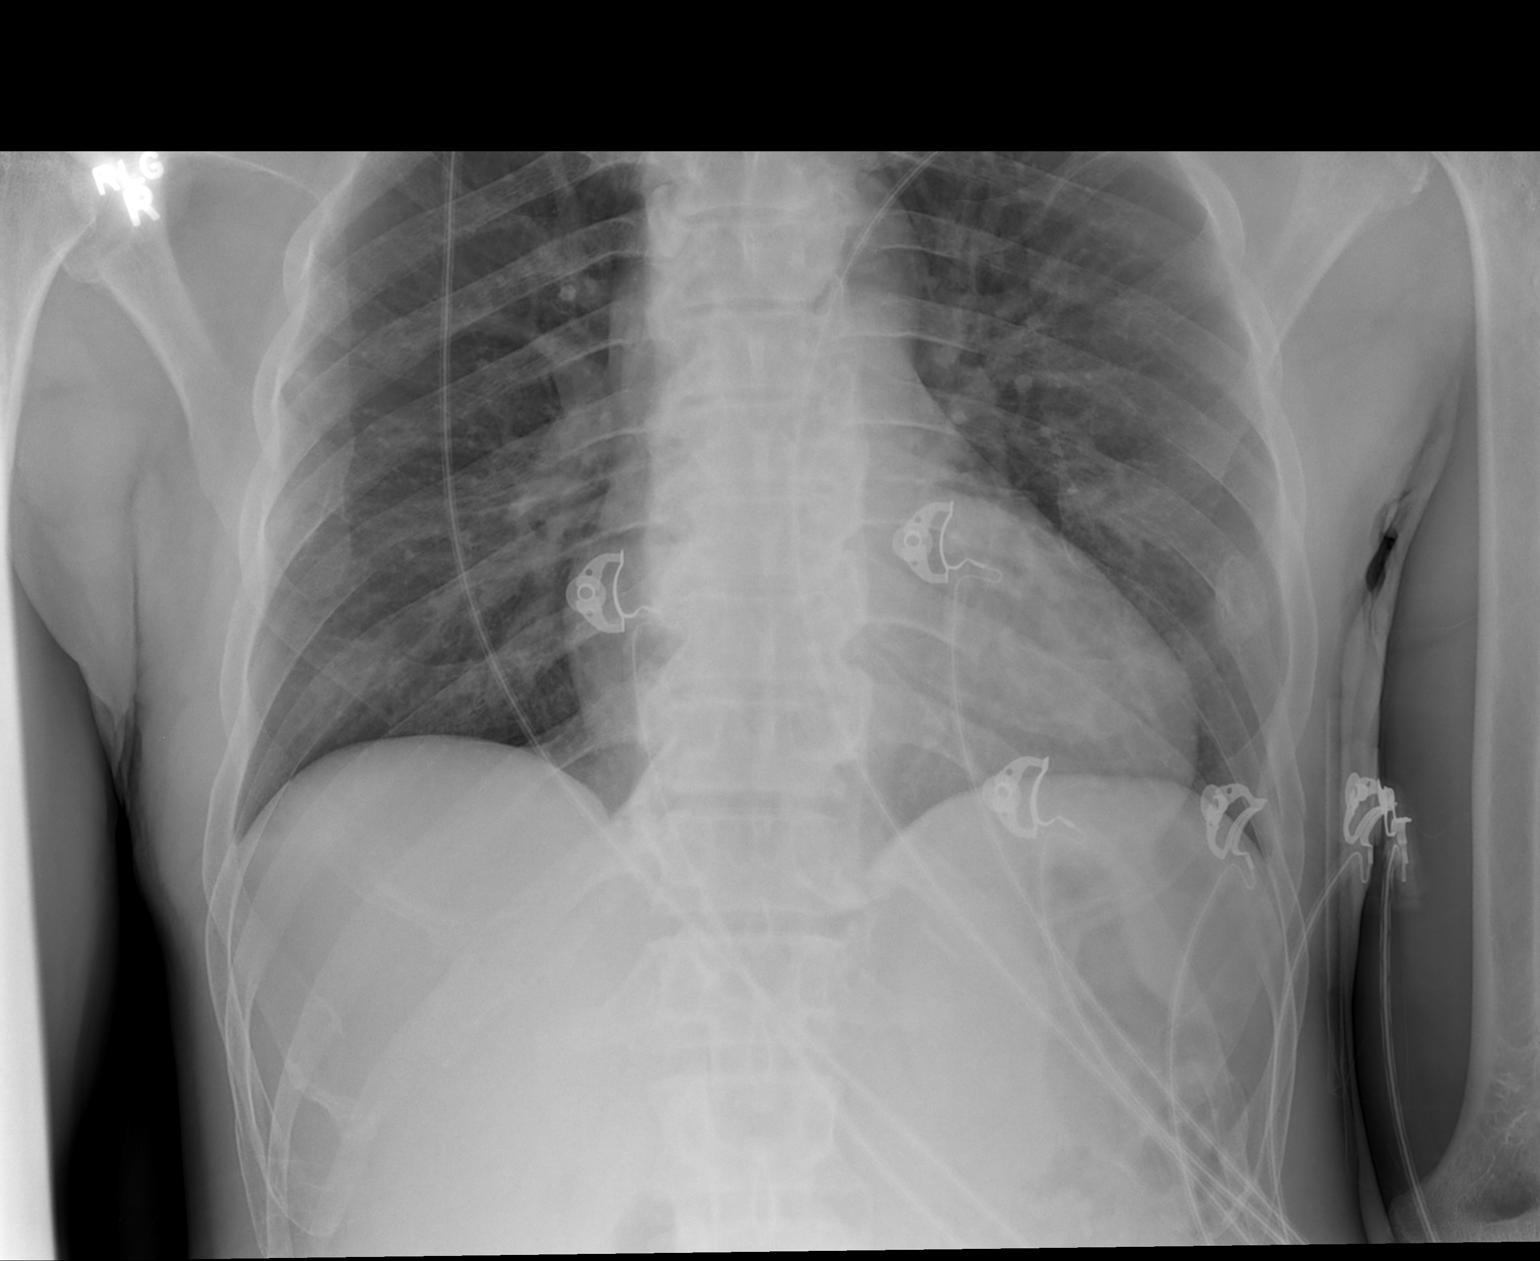

[2 of 2 positions shown; findings below may reference images not displayed]

FINDINGS: Mild cardiac enlargement with normal pulmonary
vascularity.  No focal consolidation or airspace disease in the
lungs.  No blunting of costophrenic angles.  No pneumothorax.
Mediastinal contours appear intact. To fractures in the left
seventh rib, one appears to be acute in the other appears to be
healing.  Both are new since the previous study.
IMPRESSION: No evidence of active pulmonary disease.  Acute and healing
fractures of the left seventh rib, new since previous study.

## 2015-10-30 ENCOUNTER — Encounter (HOSPITAL_COMMUNITY): Payer: Self-pay

## 2015-10-30 ENCOUNTER — Emergency Department (HOSPITAL_COMMUNITY): Payer: Self-pay

## 2015-10-30 ENCOUNTER — Inpatient Hospital Stay (HOSPITAL_COMMUNITY)
Admission: EM | Admit: 2015-10-30 | Discharge: 2015-11-06 | DRG: 193 | Disposition: A | Discharge status: Skilled Nursing Facility | Payer: Self-pay | Attending: Internal Medicine | Admitting: Internal Medicine

## 2015-10-30 DIAGNOSIS — E871 Hypo-osmolality and hyponatremia: Secondary | ICD-10-CM | POA: Diagnosis present

## 2015-10-30 DIAGNOSIS — M109 Gout, unspecified: Secondary | ICD-10-CM | POA: Diagnosis present

## 2015-10-30 DIAGNOSIS — I10 Essential (primary) hypertension: Secondary | ICD-10-CM | POA: Diagnosis present

## 2015-10-30 DIAGNOSIS — D649 Anemia, unspecified: Secondary | ICD-10-CM | POA: Diagnosis present

## 2015-10-30 DIAGNOSIS — R1310 Dysphagia, unspecified: Secondary | ICD-10-CM | POA: Diagnosis present

## 2015-10-30 DIAGNOSIS — R945 Abnormal results of liver function studies: Secondary | ICD-10-CM | POA: Diagnosis present

## 2015-10-30 DIAGNOSIS — F419 Anxiety disorder, unspecified: Secondary | ICD-10-CM | POA: Diagnosis present

## 2015-10-30 DIAGNOSIS — E43 Unspecified severe protein-calorie malnutrition: Secondary | ICD-10-CM | POA: Diagnosis present

## 2015-10-30 DIAGNOSIS — F32A Depression, unspecified: Secondary | ICD-10-CM | POA: Diagnosis present

## 2015-10-30 DIAGNOSIS — R7989 Other specified abnormal findings of blood chemistry: Secondary | ICD-10-CM | POA: Diagnosis present

## 2015-10-30 DIAGNOSIS — M79671 Pain in right foot: Secondary | ICD-10-CM

## 2015-10-30 DIAGNOSIS — M79672 Pain in left foot: Secondary | ICD-10-CM

## 2015-10-30 DIAGNOSIS — E8809 Other disorders of plasma-protein metabolism, not elsewhere classified: Secondary | ICD-10-CM | POA: Diagnosis present

## 2015-10-30 DIAGNOSIS — R000 Tachycardia, unspecified: Secondary | ICD-10-CM | POA: Diagnosis present

## 2015-10-30 DIAGNOSIS — F329 Major depressive disorder, single episode, unspecified: Secondary | ICD-10-CM | POA: Diagnosis present

## 2015-10-30 DIAGNOSIS — F10239 Alcohol dependence with withdrawal, unspecified: Secondary | ICD-10-CM | POA: Diagnosis present

## 2015-10-30 DIAGNOSIS — L8990 Pressure ulcer of unspecified site, unspecified stage: Secondary | ICD-10-CM | POA: Insufficient documentation

## 2015-10-30 DIAGNOSIS — E876 Hypokalemia: Secondary | ICD-10-CM | POA: Diagnosis present

## 2015-10-30 DIAGNOSIS — R531 Weakness: Secondary | ICD-10-CM

## 2015-10-30 DIAGNOSIS — J189 Pneumonia, unspecified organism: Principal | ICD-10-CM | POA: Diagnosis present

## 2015-10-30 DIAGNOSIS — L89152 Pressure ulcer of sacral region, stage 2: Secondary | ICD-10-CM | POA: Diagnosis present

## 2015-10-30 DIAGNOSIS — K219 Gastro-esophageal reflux disease without esophagitis: Secondary | ICD-10-CM | POA: Diagnosis present

## 2015-10-30 DIAGNOSIS — F1020 Alcohol dependence, uncomplicated: Secondary | ICD-10-CM | POA: Diagnosis present

## 2015-10-30 DIAGNOSIS — Z9114 Patient's other noncompliance with medication regimen: Secondary | ICD-10-CM

## 2015-10-30 DIAGNOSIS — Z681 Body mass index (BMI) 19 or less, adult: Secondary | ICD-10-CM

## 2015-10-30 DIAGNOSIS — F418 Other specified anxiety disorders: Secondary | ICD-10-CM | POA: Diagnosis present

## 2015-10-30 DIAGNOSIS — Z9119 Patient's noncompliance with other medical treatment and regimen: Secondary | ICD-10-CM

## 2015-10-30 LAB — CBC WITH DIFFERENTIAL/PLATELET
BASOS ABS: 0.1 10*3/uL (ref 0.0–0.1)
Basophils Relative: 1 %
Eosinophils Absolute: 0 10*3/uL (ref 0.0–0.7)
Eosinophils Relative: 0 %
HCT: 36.7 % — ABNORMAL LOW (ref 39.0–52.0)
Hemoglobin: 12.5 g/dL — ABNORMAL LOW (ref 13.0–17.0)
LYMPHS ABS: 2.2 10*3/uL (ref 0.7–4.0)
Lymphocytes Relative: 22 %
MCH: 26.7 pg (ref 26.0–34.0)
MCHC: 34.1 g/dL (ref 30.0–36.0)
MCV: 78.4 fL (ref 78.0–100.0)
MONO ABS: 0.9 10*3/uL (ref 0.1–1.0)
MONOS PCT: 9 %
Neutro Abs: 6.7 10*3/uL (ref 1.7–7.7)
Neutrophils Relative %: 68 %
PLATELETS: 269 10*3/uL (ref 150–400)
RBC: 4.68 MIL/uL (ref 4.22–5.81)
RDW: 13.9 % (ref 11.5–15.5)
WBC: 9.9 10*3/uL (ref 4.0–10.5)

## 2015-10-30 LAB — COMPREHENSIVE METABOLIC PANEL
ALBUMIN: 2.8 g/dL — AB (ref 3.5–5.0)
ALT: 41 U/L (ref 17–63)
AST: 81 U/L — AB (ref 15–41)
Alkaline Phosphatase: 96 U/L (ref 38–126)
Anion gap: 16 — ABNORMAL HIGH (ref 5–15)
BUN: 7 mg/dL (ref 6–20)
CHLORIDE: 91 mmol/L — AB (ref 101–111)
CO2: 22 mmol/L (ref 22–32)
Calcium: 8.9 mg/dL (ref 8.9–10.3)
Creatinine, Ser: 0.75 mg/dL (ref 0.61–1.24)
GFR calc Af Amer: >60 mL/min (ref >60)
GFR calc non Af Amer: >60 mL/min (ref >60)
GLUCOSE: 94 mg/dL (ref 65–99)
POTASSIUM: 3.7 mmol/L (ref 3.5–5.1)
SODIUM: 129 mmol/L — AB (ref 135–145)
Total Bilirubin: 1.2 mg/dL (ref 0.3–1.2)
Total Protein: 8.2 g/dL — ABNORMAL HIGH (ref 6.5–8.1)

## 2015-10-30 LAB — RAPID URINE DRUG SCREEN, HOSP PERFORMED
AMPHETAMINES: NOT DETECTED
BARBITURATES: NOT DETECTED
BENZODIAZEPINES: NOT DETECTED
Cocaine: NOT DETECTED
Opiates: NOT DETECTED
TETRAHYDROCANNABINOL: NOT DETECTED

## 2015-10-30 LAB — LACTIC ACID, PLASMA
Lactic Acid, Venous: 1.7 mmol/L (ref 0.5–2.0)
Lactic Acid, Venous: 1.6 mmol/L (ref 0.5–2.0)

## 2015-10-30 LAB — ETHANOL

## 2015-10-30 MED ORDER — PIPERACILLIN-TAZOBACTAM 3.375 G IVPB 30 MIN
3.3750 g | Freq: Once | INTRAVENOUS | Status: AC
  Administered 2015-10-30: 3.375 g via INTRAVENOUS
  Filled 2015-10-30: qty 50

## 2015-10-30 MED ORDER — SODIUM CHLORIDE 0.9 % IV BOLUS (SEPSIS)
1000.0000 mL | Freq: Once | INTRAVENOUS | Status: AC
  Administered 2015-10-30: 1000 mL via INTRAVENOUS

## 2015-10-30 MED ORDER — LORAZEPAM 1 MG PO TABS
1.0000 mg | ORAL_TABLET | Freq: Once | ORAL | Status: AC
  Administered 2015-10-30: 1 mg via ORAL
  Filled 2015-10-30: qty 1

## 2015-10-30 MED ORDER — LORAZEPAM 2 MG/ML IJ SOLN
2.0000 mg | INTRAMUSCULAR | Status: DC | PRN
  Administered 2015-10-31 – 2015-11-02 (×9): 2 mg via INTRAVENOUS
  Filled 2015-10-30 (×9): qty 1

## 2015-10-30 MED ORDER — ACETAMINOPHEN 325 MG PO TABS
650.0000 mg | ORAL_TABLET | Freq: Once | ORAL | Status: AC
  Administered 2015-10-30: 650 mg via ORAL
  Filled 2015-10-30: qty 2

## 2015-10-30 MED ORDER — FOLIC ACID 1 MG PO TABS
1.0000 mg | ORAL_TABLET | Freq: Every day | ORAL | Status: DC
  Administered 2015-10-31 – 2015-11-06 (×7): 1 mg via ORAL
  Filled 2015-10-30 (×7): qty 1

## 2015-10-30 MED ORDER — THIAMINE HCL 100 MG/ML IJ SOLN
100.0000 mg | Freq: Once | INTRAMUSCULAR | Status: AC
  Administered 2015-10-31: 100 mg via INTRAVENOUS
  Filled 2015-10-30: qty 2

## 2015-10-30 MED ORDER — ADULT MULTIVITAMIN W/MINERALS CH
1.0000 | ORAL_TABLET | Freq: Every day | ORAL | Status: DC
  Administered 2015-10-31 – 2015-11-06 (×7): 1 via ORAL
  Filled 2015-10-30 (×7): qty 1

## 2015-10-30 MED ORDER — DIAZEPAM 5 MG/ML IJ SOLN
7.5000 mg | Freq: Once | INTRAMUSCULAR | Status: AC
  Administered 2015-10-30: 7.5 mg via INTRAVENOUS
  Filled 2015-10-30: qty 2

## 2015-10-30 MED ORDER — METOPROLOL TARTRATE 1 MG/ML IV SOLN
5.0000 mg | Freq: Once | INTRAVENOUS | Status: AC
  Administered 2015-10-31: 5 mg via INTRAVENOUS
  Filled 2015-10-30 (×2): qty 5

## 2015-10-30 MED ORDER — NAPROXEN 500 MG PO TABS: 500.0000 mg | ORAL_TABLET | Freq: Two times a day (BID) | ORAL | Status: AC

## 2015-10-30 MED ORDER — VITAMIN B-1 100 MG PO TABS
100.0000 mg | ORAL_TABLET | Freq: Every day | ORAL | Status: DC
Start: 2015-10-31 — End: 2015-11-07
  Administered 2015-10-31 – 2015-11-06 (×7): 100 mg via ORAL
  Filled 2015-10-30 (×7): qty 1

## 2015-10-30 MED ORDER — VANCOMYCIN HCL IN DEXTROSE 1-5 GM/200ML-% IV SOLN
1000.0000 mg | Freq: Once | INTRAVENOUS | Status: AC
  Administered 2015-10-30: 1000 mg via INTRAVENOUS
  Filled 2015-10-30: qty 200

## 2015-10-30 NOTE — ED Notes (Addendum)
[name], PA-C informed of VS's.

## 2015-10-30 NOTE — ED Notes (Addendum)
Pt has soiled himself.  Pt admits to drinking beer prior to arrival & drinks a 6 pk qd.

## 2015-10-30 NOTE — ED Notes (Signed)
No answer from patient when called from lobby x 1.  

## 2015-10-30 NOTE — ED Provider Notes (Signed)
CSN: [id number]     Arrival date & time 10/30/15  1333 History  By signing my name below, I, Marisue Humble, attest that this documentation has been prepared under the direction and in the presence of non-physician practitioner, Santiago Glad, PA-C. Electronically Signed: Marisue Humble, Scribe. 10/30/2015. 7:27 PM.   Chief Complaint  Patient presents with  . Gout   The history is provided by the patient. No language interpreter was used.   HPI Comments:  [name] is a 60 y.o. male with PMHx of alcohol dependence and gout brought in by EMS who presents to the Emergency Department complaining of intermittent heel pain in BL feet for ~2 months.  Pt also notes bowel and urinary incontinence for the past year. He states he is always incontinent and is unable to use the toilet. No alleviating factors noted. Pt states he has had gout pain in his knees in the past and states gout runs in his family.  Pt reports he had a few beers this morning. Pt denies trauma, fever, chills, numbness, or tingling.  Denies any acute injury or trauma to his feet.    Past Medical History  Diagnosis Date  . Acute encephalopathy 09/14/2012  . Alcohol dependence (HCC) 09/14/2012  . Depression 09/14/2012  . Anxiety 09/14/2012   History reviewed. No pertinent past surgical history. No family history on file. Social History  Substance Use Topics  . Smoking status: Never Smoker   . Smokeless tobacco: Never Used  . Alcohol Use: No     Comment: "Stopped a month ago"    Review of Systems  Constitutional: Negative for fever and chills.  Gastrointestinal:       Bowel incontinence   Genitourinary:       Urinary incontinence  Musculoskeletal: Positive for arthralgias (BL feet).  Neurological: Positive for weakness. Negative for numbness.  All other systems reviewed and are negative.  Allergies  Review of patient's allergies indicates no known allergies.  Home Medications   Prior to Admission medications    Medication Sig Start Date End Date Taking? Authorizing Provider  indomethacin (INDOCIN) 25 MG capsule Take 1 capsule (25 mg total) by mouth 3 (three) times daily as needed. 11/06/12   Tiffany Neva Seat, PA-C  naproxen (NAPROSYN) 500 MG tablet Take 1 tablet (500 mg total) by mouth 2 (two) times daily. 10/30/15   Korene Dula, PA-C  naproxen sodium (ANAPROX) 220 MG tablet Take 220 mg by mouth 2 (two) times daily with a meal.    Historical Provider, MD  oxyCODONE-acetaminophen (PERCOCET/ROXICET) 5-325 MG per tablet Take 2 tablets by mouth every 4 (four) hours as needed for pain. 10/25/12   Elpidio Anis, PA-C  oxyCODONE-acetaminophen (PERCOCET/ROXICET) 5-325 MG per tablet Take 1 tablet by mouth every 6 (six) hours as needed for pain. 11/06/12   Tiffany Neva Seat, PA-C  predniSONE (DELTASONE) 10 MG tablet Take 4 tablets (40 mg total) by mouth daily. 11/06/12   Tiffany Neva Seat, PA-C   BP 136/92 mmHg  Pulse 133  Temp(Src) 100.6 F (38.1 C) (Oral)  Resp 20  SpO2 98% Physical Exam  Constitutional: He appears well-developed and well-nourished. No distress.  HENT:  Head: Normocephalic and atraumatic.  Mouth/Throat: Oropharynx is clear and moist.  Eyes: EOM are normal. Pupils are equal, round, and reactive to light. Right eye exhibits no discharge. Left eye exhibits no discharge.  Neck: Normal range of motion. Neck supple.  Cardiovascular: Normal rate, regular rhythm and normal heart sounds.   Pulses:  Dorsalis pedis pulses are 2+ on the right side, and 2+ on the left side.  Pulmonary/Chest: Effort normal and breath sounds normal. No respiratory distress.  Musculoskeletal: Normal range of motion.  No erythema, edema, or warmth of bl feet. 2+ PD pulse; no TTP of BL feet  Neurological: He is alert. He has normal strength. Coordination and gait normal.  distal sensation to both feet intact  Skin: Skin is warm and dry. No rash noted. He is not diaphoretic.  Psychiatric: He has a normal mood and affect.  His behavior is normal.  Nursing note and vitals reviewed.   ED Course  Procedures  DIAGNOSTIC STUDIES:  Oxygen Saturation is 100% on RA, normal by my interpretation.    COORDINATION OF CARE:  3:15 PM RN called for patient in the lobby with no answer.    6:44 PM Will discharge with anti-inflammatory medication. Discussed treatment plan with pt at bedside and pt agreed to plan.  7:23 PM Re-evaluated pt due to change in vitals. Pt is now more confused.  Patient incontinent of stool.  Pt reports having 2-3 beers today. He states he does drink every day and usually drinks ~4 beers. Last drink was this morning.  Denies cough, shortness of breath, sore throat, vomiting, abdominal pain, back pain, or any other symptoms. Will consult attending physician.  Patient will be moved to the acute ED.  Labs ordered.  Ativan also ordered.  IVF ordered. Blood cultures, UA, and CXR also ordered.    Labs Review Labs Reviewed - No data to display  Imaging Review No results found. I have personally reviewed and evaluated these images and lab results as part of my medical decision-making.   EKG Interpretation None     MDM   Final diagnoses:  Heel pain, bilateral   Patient initially presented with bilateral foot pain on the plantar aspect of the heels bilaterally.  No signs of Gout or other infection of his feet.  During ED course the patient became febrile, increasingly confused, and more tachycardic.   He has a history of Alcoholism and had his last drink earlier today.  Patient given Ativan in the ED.  Labs, blood cultures, CXR, and UA ordered.  Patient moved out of fast track and into the acute ED where another provider will follow up on results and dispo accordingly.    I personally performed the services described in this documentation, which was scribed in my presence. The recorded information has been reviewed and is accurate.    Santiago GladHeather Lars Jeziorski, PA-C 10/30/15 2359  Raeford RazorStephen Kohut,  MD 10/31/15 [phone]

## 2015-10-30 NOTE — ED Provider Notes (Signed)
Medical screening examination/treatment/procedure(s) were conducted as a shared visit with non-physician practitioner(s) and myself.  I personally evaluated the patient during the encounter.   EKG Interpretation None     59yM with fever, cough, confusion. Currently confused and not a very good historian. Can't tell me much in terms on onset, progression, associated symptoms, etc. Had empiric vanc/sozyn. Subsequent chest x-ray concerning for multifocal pneumonia. Moderate hyponatremia. Possibly related to alcohol abuse versus SIADH from pulmonary process. Unfortunately, patient initially went to fast track. He was in the emergency room for several hours prior to coming to the higher acuity side and has been increasingly tremulous, hypertensive and tachycardic which is presumably from etoh withdrawal. Needs admission.    Donaven Criswell KohuRaeford Razort, MD 10/31/15 [phone]

## 2015-10-30 NOTE — ED Notes (Signed)
Pt presents with c/o pain in his bilaterally from gout. Pt has not seen a doctor in a very long time and he is also very hypertensive. Ambulatory on scene, also incontinent.

## 2015-10-30 NOTE — ED Notes (Signed)
Blood Cx x 2 drawn and sent to lab

## 2015-10-30 NOTE — ED Notes (Signed)
Pt spilled urine on floor.

## 2015-10-30 NOTE — ED Notes (Signed)
Rn Merle to start line and draw labs

## 2015-10-30 NOTE — H&P (Signed)
[facility] History and Physical  Griffith Santilli YQM:[id number] DOB: 1955-09-14 DOA: 10/30/2015  Referring physician: Raeford Razor, MD PCP: No primary care provider on file.   Chief Complaint: Gout.  HPI: Brnadon Eoff is a 60 y.o. male with below PMH and medication noncompliance who comes to the ER due to having pain on his feet, which he stated were due to gout.  When the patient was seen in the emergency department, he was confused, tremulous, tachycardic, hypertensive and found to have symptoms of delirium tremens and was treated with benzodiazepines. He is currently sedated and unable to provide history.  Workup is significant for mild anemia, hyponatremia and a chest radiograph showing multifocal pneumonia.   Review of Systems:    Past Medical History  Diagnosis Date  . Acute encephalopathy 09/14/2012  . Alcohol dependence (HCC) 09/14/2012  . Depression 09/14/2012  . Anxiety 09/14/2012   History reviewed. No pertinent past surgical history. Social History:  reports that he has never smoked. He has never used smokeless tobacco. He reports that he does not drink alcohol or use illicit drugs.  No Known Allergies  History reviewed. No pertinent family history.   Prior to Admission medications   Medication Sig Start Date End Date Taking? Authorizing Provider  indomethacin (INDOCIN) 25 MG capsule Take 1 capsule (25 mg total) by mouth 3 (three) times daily as needed. Patient not taking: Reported on 10/30/2015 11/06/12   Marlon Pel, PA-C  naproxen (NAPROSYN) 500 MG tablet Take 1 tablet (500 mg total) by mouth 2 (two) times daily. 10/30/15   Santiago Glad, PA-C  oxyCODONE-acetaminophen (PERCOCET/ROXICET) 5-325 MG per tablet Take 2 tablets by mouth every 4 (four) hours as needed for pain. Patient not taking: Reported on 10/30/2015 10/25/12   Elpidio Anis, PA-C  oxyCODONE-acetaminophen (PERCOCET/ROXICET) 5-325 MG per tablet Take 1 tablet by mouth every 6 (six) hours as  needed for pain. Patient not taking: Reported on 10/30/2015 11/06/12   Marlon Pel, PA-C  predniSONE (DELTASONE) 10 MG tablet Take 4 tablets (40 mg total) by mouth daily. Patient not taking: Reported on 10/30/2015 11/06/12   Marlon Pel, PA-C   Physical Exam: Filed Vitals:   10/30/15 2200 10/30/15 2230 10/30/15 2300 10/30/15 2316  BP: 177/117 172/106 172/112   Pulse: 105 110 126   Temp:    98.3 F (36.8 C)  TempSrc:    Oral  Resp: SpO2: 76% 95% 99%     Wt Readings from Last 3 Encounters:  09/16/12 67.223 kg (148 lb 3.2 oz)    General: In NAD. Sedated, wakes up briefly, then falls back to sleep.  Eyes: PERRL, normal lids, irises & conjunctiva ENT: grossly normal hearing, lips & tongue. Poor dentition. Oral mucosa is dry. Neck: no LAD, masses or thyromegaly Cardiovascular: Tachycardic at 108 bpm. No LE edema. Telemetry: Sinus tachycardia at 108 bpm. Respiratory: Bilateral rhonchi and bibasilar crackles. Decreased respiratory effort. No                                 accessory muscle use. Abdomen: soft, ntnd Skin: Positive small abrasions on left middle pretibial area. Musculoskeletal: Decreased on due to sedation. Psychiatric: Sedated. Neurologic: Sedated.           Labs on Admission:  Basic Metabolic Panel:  Recent Labs Lab 10/30/15 2019  NA 129*  K 3.7  CL 91*  CO2 22  GLUCOSE 94  BUN 7  CREATININE 0.75  CALCIUM 8.9   Liver Function Tests:  Recent Labs Lab 10/30/15 2019  AST 81*  ALT 41  ALKPHOS 96  BILITOT 1.2  PROT 8.2*  ALBUMIN 2.8*   CBC:  Recent Labs Lab 10/30/15 2019  WBC 9.9  NEUTROABS 6.7  HGB 12.5*  HCT 36.7*  MCV 78.4  PLT 269    Radiological Exams on Admission: Dg Chest 2 View  10/30/2015  CLINICAL DATA:  Weakness in the legs. Bowel in urinary incontinence for year. EXAM: CHEST  2 VIEW COMPARISON:  09/14/2012 FINDINGS: New airspace infiltration with volume loss in the right upper lung. New airspace infiltration in  the left mid lung. Changes likely represent pneumonia. Followup PA and lateral chest X-ray is recommended in 3-4 weeks following trial of antibiotic therapy to ensure resolution and exclude underlying malignancy. Heart size and pulmonary vascularity are normal. No blunting of costophrenic angles. No pneumothorax. Degenerative changes in the spine and shoulders. IMPRESSION: New airspace infiltration and volume loss in the right upper lung and new airspace infiltration in the left mid lung most likely represent multifocal pneumonia. Followup PA and lateral chest X-ray is recommended in 3-4 weeks following trial of antibiotic therapy to ensure resolution and exclude underlying malignancy. Electronically Signed   By: Burman NievesWilliam  Stevens M.D.   On: 10/30/2015 20:38     Assessment/Plan Principal Problem:   CAP (community acquired pneumonia) Admit to a stepdown. Continue supplemental oxygen. Bronchodilators as needed. Check a sputum Gram stain, culture and sensitivity. Check Legionella and strep pneumoniae urinary antigens. Follow-up blood cultures and sensitivity. Continue IV antibiotic therapy.  Active Problems:   Alcohol dependence (HCC) Continue CiWA protocol. Thiamine 100 mg by mouth daily. Folic acid 1 mg by mouth daily. Magnesium sulfate 2 g IV piggyback    Depression   Anxiety Not currently on treatment. Continue lorazepam for alcohol withdrawal syndromes.    Hypertension The patient apparently is noncompliant with treatment. Antihypertensive therapy once the patient is awake. Monitor blood pressure.    Anemia Monitor hematocrit and hemoglobin. Check anemia panel.    Hyponatremia Dilution from alcohol abuse? Pneumonia? Continue normal saline infusion.    Abnormal LFTs Recommended to alcoholism. Monitor LFTs.    Hypoalbuminemia Secondary to malnutrition due to alcoholism.    Code Status: Full code. DVT Prophylaxis: SCDs. Family Communication:  Disposition Plan: Admit  to stepdown for IV antibiotic therapy and alcohol withdrawal symptoms treatment.  Time spent: Over 70 minutes were spent in the purposes of this admission. Bobette Moavid Manuel Kirstina Leinweber, M.D. [facility] Pager [phone].

## 2015-10-31 LAB — COMPREHENSIVE METABOLIC PANEL
ALK PHOS: 87 U/L (ref 38–126)
ALT: 34 U/L (ref 17–63)
AST: 61 U/L — ABNORMAL HIGH (ref 15–41)
Albumin: 2.6 g/dL — ABNORMAL LOW (ref 3.5–5.0)
Anion gap: 13 (ref 5–15)
BILIRUBIN TOTAL: 1.1 mg/dL (ref 0.3–1.2)
BUN: 7 mg/dL (ref 6–20)
CALCIUM: 8.6 mg/dL — AB (ref 8.9–10.3)
CO2: 23 mmol/L (ref 22–32)
CREATININE: 0.66 mg/dL (ref 0.61–1.24)
Chloride: 95 mmol/L — ABNORMAL LOW (ref 101–111)
GFR calc non Af Amer: >60 mL/min (ref >60)
GLUCOSE: 94 mg/dL (ref 65–99)
Potassium: 3.8 mmol/L (ref 3.5–5.1)
SODIUM: 131 mmol/L — AB (ref 135–145)
TOTAL PROTEIN: 7.3 g/dL (ref 6.5–8.1)

## 2015-10-31 LAB — CBC WITH DIFFERENTIAL/PLATELET
BASOS ABS: 0.1 10*3/uL (ref 0.0–0.1)
Basophils Relative: 1 %
EOS ABS: 0.1 10*3/uL (ref 0.0–0.7)
EOS PCT: 1 %
HCT: 38 % — ABNORMAL LOW (ref 39.0–52.0)
HEMOGLOBIN: 13.1 g/dL (ref 13.0–17.0)
Lymphocytes Relative: 20 %
Lymphs Abs: 1.9 10*3/uL (ref 0.7–4.0)
MCH: 27.1 pg (ref 26.0–34.0)
MCHC: 34.5 g/dL (ref 30.0–36.0)
MCV: 78.7 fL (ref 78.0–100.0)
MONOS PCT: 12 %
Monocytes Absolute: 1.2 10*3/uL — ABNORMAL HIGH (ref 0.1–1.0)
Neutro Abs: 6.4 10*3/uL (ref 1.7–7.7)
Neutrophils Relative %: 66 %
Platelets: 262 10*3/uL (ref 150–400)
RBC: 4.83 MIL/uL (ref 4.22–5.81)
RDW: 13.8 % (ref 11.5–15.5)
WBC: 9.7 10*3/uL (ref 4.0–10.5)

## 2015-10-31 LAB — URINALYSIS, ROUTINE W REFLEX MICROSCOPIC
Bilirubin Urine: NEGATIVE
Glucose, UA: NEGATIVE mg/dL
Ketones, ur: 15 mg/dL — AB
Leukocytes, UA: NEGATIVE
Nitrite: NEGATIVE
Protein, ur: NEGATIVE mg/dL
Specific Gravity, Urine: 1.009 (ref 1.005–1.030)
pH: 6 (ref 5.0–8.0)

## 2015-10-31 LAB — MAGNESIUM: Magnesium: 1.1 mg/dL — ABNORMAL LOW (ref 1.7–2.4)

## 2015-10-31 LAB — URINE MICROSCOPIC-ADD ON
Bacteria, UA: NONE SEEN
WBC, UA: NONE SEEN WBC/hpf (ref 0–5)

## 2015-10-31 LAB — STREP PNEUMONIAE URINARY ANTIGEN: Strep Pneumo Urinary Antigen: NEGATIVE

## 2015-10-31 LAB — PHOSPHORUS: Phosphorus: 4.5 mg/dL (ref 2.5–4.6)

## 2015-10-31 LAB — HIV ANTIBODY (ROUTINE TESTING W REFLEX): HIV Screen 4th Generation wRfx: NONREACTIVE

## 2015-10-31 MED ORDER — VANCOMYCIN HCL IN DEXTROSE 1-5 GM/200ML-% IV SOLN
1000.0000 mg | Freq: Three times a day (TID) | INTRAVENOUS | Status: AC
Start: 2015-10-31 — End: 2015-11-04
  Administered 2015-10-31 – 2015-11-04 (×12): 1000 mg via INTRAVENOUS
  Filled 2015-10-31 (×12): qty 200

## 2015-10-31 MED ORDER — IPRATROPIUM-ALBUTEROL 0.5-2.5 (3) MG/3ML IN SOLN: 3.0000 mL | Freq: Four times a day (QID) | RESPIRATORY_TRACT | Status: DC | PRN

## 2015-10-31 MED ORDER — PIPERACILLIN-TAZOBACTAM 3.375 G IVPB
3.3750 g | Freq: Three times a day (TID) | INTRAVENOUS | Status: AC
  Administered 2015-10-31 – 2015-11-04 (×12): 3.375 g via INTRAVENOUS
  Filled 2015-10-31 (×12): qty 50

## 2015-10-31 MED ORDER — POTASSIUM CHLORIDE IN NACL 20-0.9 MEQ/L-% IV SOLN
INTRAVENOUS | Status: AC
  Administered 2015-10-31: 02:00:00 via INTRAVENOUS
  Filled 2015-10-31 (×2): qty 1000

## 2015-10-31 MED ORDER — MAGNESIUM SULFATE 4 GM/100ML IV SOLN
4.0000 g | Freq: Once | INTRAVENOUS | Status: AC
  Administered 2015-10-31: 4 g via INTRAVENOUS
  Filled 2015-10-31: qty 100

## 2015-10-31 MED ORDER — HYDRALAZINE HCL 20 MG/ML IJ SOLN
5.0000 mg | Freq: Four times a day (QID) | INTRAMUSCULAR | Status: DC | PRN
  Administered 2015-10-31: 5 mg via INTRAVENOUS
  Filled 2015-10-31: qty 1

## 2015-10-31 NOTE — ED Notes (Signed)
Called Staffing office, spoke with Marcelino DusterMichelle, informed pt needs Recruitment consultantsafety sitter.  Per Marcelino DusterMichelle, will add.

## 2015-10-31 NOTE — Progress Notes (Signed)
[facility] PROGRESS NOTE  [name] RUE:[id number] DOB: Mar 18, 1956 DOA: 10/30/2015 PCP: No primary care provider on file.  Assessment/Plan: Principal Problem:   CAP (community acquired pneumonia) - Patient to continue current antibiotic regimen - Chest x-ray obtained which reported new airspace infiltration and volume loss in the right upper lung and new airspace infiltration in left midlung most likely representing multifocal pneumonia it is recommended to reassess which chest x-ray in 3-4 weeks - WBC reassuring  Active Problems:   Alcohol dependence (HCC) - continue CIWA protocol    Depression/ Anxiety - Continue CIWA protocol which will help with anxiety otherwise will recommend patient follow-up with outpatient primary care or psychiatrist for further evaluation recommendations.    Hypertension - Place order for when necessary hydralazine - Urine drug screen negative for cocaine as such may use beta blocker for when necessary blood pressure control    Anemia   Hyponatremia - Most likely due to history of alcoholism. Is currently improving    Abnormal LFTs - Could be related secondary to history of alcohol use    Hypoalbuminemia   Code Status: Full Family Communication: d/c patient Disposition Plan: Stepdown   Consultants:  None  Procedures:  None  Antibiotics:  Zosyn  Vancomycin  HPI/Subjective: Pt has no new complaints  Objective: Filed Vitals:   10/31/15 1230 10/31/15 1300  BP: 179/105 181/119  Pulse:    Temp:    Resp: 22 18    Intake/Output Summary (Last 24 hours) at 10/31/15 1440 Last data filed at 10/31/15 0748  Gross per 24 hour  Intake      0 ml  Output    200 ml  Net   -200 ml   Filed Weights   10/31/15 0347  Weight: 65.772 kg (145 lb)    Exam:   General:  Pt in nad, alert and awake, still confused  Cardiovascular: rrr, no mrg  Respiratory: cta bl, no wheezes, [place] in place, rhales  Abdomen: soft, nt,  nd  Musculoskeletal: no cyanosis or clubbing   Data Reviewed: Basic Metabolic Panel:  Recent Labs Lab 10/30/15 2019 10/31/15 0113 10/31/15 0540  NA 129*  --  131*  K 3.7  --  3.8  CL 91*  --  95*  CO2 22  --  23  GLUCOSE 94  --  94  BUN 7  --  7  CREATININE 0.75  --  0.66  CALCIUM 8.9  --  8.6*  MG  --  1.1*  --   PHOS  --  4.5  --    Liver Function Tests:  Recent Labs Lab 10/30/15 2019 10/31/15 0540  AST 81* 61*  ALT 41 34  ALKPHOS 96 87  BILITOT 1.2 1.1  PROT 8.2* 7.3  ALBUMIN 2.8* 2.6*   No results for input(s): LIPASE, AMYLASE in the last 168 hours. No results for input(s): AMMONIA in the last 168 hours. CBC:  Recent Labs Lab 10/30/15 2019 10/31/15 0455  WBC 9.9 9.7  NEUTROABS 6.7 6.4  HGB 12.5* 13.1  HCT 36.7* 38.0*  MCV 78.4 78.7  PLT 269 262   Cardiac Enzymes: No results for input(s): CKTOTAL, CKMB, CKMBINDEX, TROPONINI in the last 168 hours. BNP (last 3 results) No results for input(s): BNP in the last 8760 hours.  ProBNP (last 3 results) No results for input(s): PROBNP in the last 8760 hours.  CBG: No results for input(s): GLUCAP in the last 168 hours.  Recent Results (from the past 240 hour(s))  Blood  culture (routine x 2)     Status: None (Preliminary result)   Collection Time: 10/30/15  8:35 PM  Result Value Ref Range Status   Specimen Description BLOOD RIGHT ARM  Final   Special Requests BOTTLES DRAWN AEROBIC AND ANAEROBIC 5ML  Final   Culture   Final    NO GROWTH < 24 HOURS Performed at [facility]    Report Status PENDING  Incomplete  Blood culture (routine x 2)     Status: None (Preliminary result)   Collection Time: 10/30/15 10:05 PM  Result Value Ref Range Status   Specimen Description BLOOD RIGHT ANTECUBITAL  Final   Special Requests BOTTLES DRAWN AEROBIC AND ANAEROBIC 9 ML  Final   Culture   Final    NO GROWTH < 24 HOURS [facility]    Report Status PENDING  Incomplete      Studies: Dg Chest 2 View  10/30/2015  CLINICAL DATA:  Weakness in the legs. Bowel in urinary incontinence for year. EXAM: CHEST  2 VIEW COMPARISON:  09/14/2012 FINDINGS: New airspace infiltration with volume loss in the right upper lung. New airspace infiltration in the left mid lung. Changes likely represent pneumonia. Followup PA and lateral chest X-ray is recommended in 3-4 weeks following trial of antibiotic therapy to ensure resolution and exclude underlying malignancy. Heart size and pulmonary vascularity are normal. No blunting of costophrenic angles. No pneumothorax. Degenerative changes in the spine and shoulders. IMPRESSION: New airspace infiltration and volume loss in the right upper lung and new airspace infiltration in the left mid lung most likely represent multifocal pneumonia. Followup PA and lateral chest X-ray is recommended in 3-4 weeks following trial of antibiotic therapy to ensure resolution and exclude underlying malignancy. Electronically Signed   By: Burman NievesWilliam  Stevens M.D.   On: 10/30/2015 20:38    Scheduled Meds: . folic acid  1 mg Oral Daily  . multivitamin with minerals  1 tablet Oral Daily  . thiamine  100 mg Oral Daily   Continuous Infusions: . 0.9 % NaCl with KCl 20 mEq / L 100 mL/hr at 10/31/15 0154  . piperacillin-tazobactam (ZOSYN)  IV Stopped (10/31/15 1144)  . vancomycin Stopped (10/31/15 0925)    Time spent: > 35 minutes   Penny PiaVEGA, Johnn Krasowski  [facility] Pager [phone] If 7PM-7AM, please contact night-coverage at [email or web address], password [email or web address] 10/31/2015, 2:40 PM  LOS: 1 day

## 2015-10-31 NOTE — Progress Notes (Signed)
Pharmacy Antibiotic Note  [name] HedgeWillie Shaffer is a 60 y.o. male admitted on 10/30/2015 with pneumonia.  Pharmacy has been consulted for Vancomycin and Zosyn dosing.  First dose of antibiotics already given in ED.  Plan: Vancomycin 1gm IV every 8 hours.  Goal trough 15-20 mcg/mL.  Zosyn 3.375g IV Q8H infused over 4hrs.   Height: 5' 10.5" (179.1 cm) Weight: 145 lb (65.772 kg) IBW/kg (Calculated) : 74.15  Temp (24hrs), Avg:99.1 F (37.3 C), Min:98.3 F (36.8 C), Max:100.6 F (38.1 C)   Recent Labs Lab 10/30/15 2019 10/30/15 2020 10/30/15 2201  WBC 9.9  --   --   CREATININE 0.75  --   --   LATICACIDVEN  --  1.7 1.6    Estimated Creatinine Clearance: 94.8 mL/min (by C-G formula based on Cr of 0.75).    No Known Allergies  Antimicrobials this admission: Vancomycin 3/22 >> Zosyn 3/22 >>  Microbiology results: Pending  Thank you for allowing pharmacy to be a part of this patient's care.  Aleene DavidsonGrimsley Jr, Raeleigh Guinn Crowford 10/31/2015 4:10 AM

## 2015-10-31 NOTE — ED Notes (Signed)
Attempted to obtain urine sample without success.  

## 2015-10-31 NOTE — ED Notes (Signed)
Pt attempted to get OOB; pt was incontinent of stool and urine; pt pulled out IV and pulled all monitoring equipment off; pt bathed and put back to bed after linen change and complete bath

## 2015-10-31 NOTE — ED Notes (Signed)
Pt incontinent of urine and was attempting OOB; pt has bed exit alarm in place

## 2015-10-31 NOTE — ED Notes (Signed)
Pt cleaned of incontinent stool.  Pt is alert but disoriented to day (Friday) and place ([address])

## 2015-11-01 DIAGNOSIS — E876 Hypokalemia: Secondary | ICD-10-CM

## 2015-11-01 DIAGNOSIS — I10 Essential (primary) hypertension: Secondary | ICD-10-CM

## 2015-11-01 DIAGNOSIS — E8809 Other disorders of plasma-protein metabolism, not elsewhere classified: Secondary | ICD-10-CM

## 2015-11-01 DIAGNOSIS — J189 Pneumonia, unspecified organism: Principal | ICD-10-CM

## 2015-11-01 DIAGNOSIS — E871 Hypo-osmolality and hyponatremia: Secondary | ICD-10-CM

## 2015-11-01 DIAGNOSIS — F10230 Alcohol dependence with withdrawal, uncomplicated: Secondary | ICD-10-CM

## 2015-11-01 DIAGNOSIS — F419 Anxiety disorder, unspecified: Secondary | ICD-10-CM

## 2015-11-01 LAB — CBC WITH DIFFERENTIAL/PLATELET
BASOS PCT: 0 %
Basophils Absolute: 0 10*3/uL (ref 0.0–0.1)
EOS PCT: 0 %
Eosinophils Absolute: 0 10*3/uL (ref 0.0–0.7)
HCT: 35 % — ABNORMAL LOW (ref 39.0–52.0)
HEMOGLOBIN: 12.6 g/dL — AB (ref 13.0–17.0)
LYMPHS PCT: 17 %
Lymphs Abs: 1.6 10*3/uL (ref 0.7–4.0)
MCH: 27.4 pg (ref 26.0–34.0)
MCHC: 36 g/dL (ref 30.0–36.0)
MCV: 76.1 fL — AB (ref 78.0–100.0)
MONO ABS: 1.2 10*3/uL — AB (ref 0.1–1.0)
Monocytes Relative: 13 %
NEUTROS ABS: 6.8 10*3/uL (ref 1.7–7.7)
NEUTROS PCT: 70 %
Platelets: 278 10*3/uL (ref 150–400)
RBC: 4.6 MIL/uL (ref 4.22–5.81)
RDW: 13.8 % (ref 11.5–15.5)
WBC: 9.6 10*3/uL (ref 4.0–10.5)

## 2015-11-01 LAB — COMPREHENSIVE METABOLIC PANEL
ALK PHOS: 75 U/L (ref 38–126)
ALT: 25 U/L (ref 17–63)
ANION GAP: 12 (ref 5–15)
AST: 39 U/L (ref 15–41)
Albumin: 2.4 g/dL — ABNORMAL LOW (ref 3.5–5.0)
BILIRUBIN TOTAL: 1.2 mg/dL (ref 0.3–1.2)
BUN: 6 mg/dL (ref 6–20)
CALCIUM: 8.2 mg/dL — AB (ref 8.9–10.3)
CO2: 26 mmol/L (ref 22–32)
CREATININE: 0.78 mg/dL (ref 0.61–1.24)
Chloride: 92 mmol/L — ABNORMAL LOW (ref 101–111)
Glucose, Bld: 102 mg/dL — ABNORMAL HIGH (ref 65–99)
Potassium: 3.2 mmol/L — ABNORMAL LOW (ref 3.5–5.1)
Sodium: 130 mmol/L — ABNORMAL LOW (ref 135–145)
TOTAL PROTEIN: 7 g/dL (ref 6.5–8.1)

## 2015-11-01 LAB — PHOSPHORUS: PHOSPHORUS: 3.9 mg/dL (ref 2.5–4.6)

## 2015-11-01 LAB — MRSA PCR SCREENING: MRSA BY PCR: NEGATIVE

## 2015-11-01 MED ORDER — ACETAMINOPHEN 325 MG PO TABS
650.0000 mg | ORAL_TABLET | Freq: Once | ORAL | Status: AC
  Administered 2015-11-01: 650 mg via ORAL
  Filled 2015-11-01: qty 2

## 2015-11-01 MED ORDER — MAGNESIUM SULFATE 2 GM/50ML IV SOLN
2.0000 g | Freq: Once | INTRAVENOUS | Status: AC
  Administered 2015-11-01: 2 g via INTRAVENOUS
  Filled 2015-11-01: qty 50

## 2015-11-01 MED ORDER — BUDESONIDE 0.25 MG/2ML IN SUSP
0.2500 mg | Freq: Two times a day (BID) | RESPIRATORY_TRACT | Status: DC
  Administered 2015-11-01 – 2015-11-06 (×10): 0.25 mg via RESPIRATORY_TRACT
  Filled 2015-11-01 (×10): qty 2

## 2015-11-01 MED ORDER — POTASSIUM CHLORIDE CRYS ER 20 MEQ PO TBCR
40.0000 meq | EXTENDED_RELEASE_TABLET | ORAL | Status: AC
  Administered 2015-11-01 (×3): 40 meq via ORAL
  Filled 2015-11-01 (×3): qty 2

## 2015-11-01 MED ORDER — ENSURE ENLIVE PO LIQD
237.0000 mL | Freq: Two times a day (BID) | ORAL | Status: DC
  Administered 2015-11-01 – 2015-11-05 (×5): 237 mL via ORAL
  Filled 2015-11-01 (×2): qty 237

## 2015-11-01 MED ORDER — CLONIDINE HCL 0.1 MG PO TABS
0.1000 mg | ORAL_TABLET | Freq: Two times a day (BID) | ORAL | Status: DC
  Administered 2015-11-01 – 2015-11-06 (×10): 0.1 mg via ORAL
  Filled 2015-11-01 (×11): qty 1

## 2015-11-01 MED ORDER — SODIUM CHLORIDE 0.9 % IV SOLN
INTRAVENOUS | Status: DC
  Administered 2015-11-01: 09:00:00 via INTRAVENOUS
  Administered 2015-11-02: 75 mL/h via INTRAVENOUS
  Administered 2015-11-04 – 2015-11-05 (×4): via INTRAVENOUS

## 2015-11-01 NOTE — ED Notes (Signed)
Pt is awake, alert, a bit confused on time, but reoriented easily.  Pt has had a sprite to drink with his medications as requested.  He is pleasant and cooperative.  Pt has had a liquid stool in bedpan approx .  He had a diaper on prior to the bedpan that had a small amount of liquid stool already in it.  Pt is following all commands and is having appropriate conversation.

## 2015-11-01 NOTE — ED Notes (Signed)
Pt can go up at 9:10.

## 2015-11-01 NOTE — Plan of Care (Signed)
Problem: Education: Goal: Knowledge of disease or condition will improve Outcome: Not Progressing Pt is confused.

## 2015-11-01 NOTE — Progress Notes (Addendum)
[facility] PROGRESS NOTE  [name] KGM:[id number] DOB: 1956/06/03 DOA: 10/30/2015 PCP: No primary care provider on file.  Assessment/Plan:   CAP (community acquired pneumonia) - will continue current antibiotic regimen for another 24 hours and switch to PO regimen - repeat CXR in 3-4 weeks to assure resolution of infiltrates -WBC and no fever reassuring -PRN nebulizer, pulmicort BID and use flutter valve    Alcohol dependence (HCC) -cessation counseling provided -continue CIWA protocol    Depression/ Anxiety - Continue CIWA protocol which will help with anxiety  -outpatient referral to psychiatry service -no SI or hallucinations     Hypertension - Place order for when necessary hydralazine - Urine drug screen negative for cocaine  - will start treatment with clonidine BID (partofhis HTN associated with withdrawal from alcohol most likely)    Anemia -of chronic disease and presumably associated with alcohol use  -will check B12 level    Hyponatremia, hypokalemia and low magnesium  - Most likely due to history of alcoholism and PNA -improving -continue IVF's -replete electrolytes as needed     Abnormal LFTs - Could be related secondary to alcohol use -has trended down and is WNL now -will monitor intermittently     Hypoalbuminemia -associated with alcohol consumption and poor oral intake  -will use ensure BID   Code Status: Full Family Communication: no family at bedside  Disposition Plan: will admit to telemetry bed   Consultants:  None  Procedures:  None  Antibiotics:  Zosyn  Vancomycin  HPI/Subjective: Pt is currently afebrile, denies CP, orthopnea and SOB. Still mildly confused, but able to follow commands and cooperative.  Objective: Filed Vitals:   11/01/15 0646 11/01/15 0824  BP: 174/109 165/108  Pulse: 116 103  Temp: 97.9 F (36.6 C)   Resp: 18 18    Intake/Output Summary (Last 24 hours) at 11/01/15 0839 Last data  filed at 11/01/15 0657  Gross per 24 hour  Intake      0 ml  Output    250 ml  Net   -250 ml   Filed Weights   10/31/15 0347  Weight: 65.772 kg (145 lb)    Exam:   General:  Pt in nad, alert and awake, able to follow commands and cooperative. Still slightly confused. No fever and denies CP and SOB  Cardiovascular: rrr, no mrg  Respiratory: good air movement bilaterally; slight exp wheezing, positive rhonchi, no crackles  Abdomen: soft, nt, nd  Musculoskeletal: no cyanosis or clubbing   Data Reviewed: Basic Metabolic Panel:  Recent Labs Lab 10/30/15 2019 10/31/15 0113 10/31/15 0540 11/01/15 0455  NA 129*  --  131* 130*  K 3.7  --  3.8 3.2*  CL 91*  --  95* 92*  CO2 22  --  23 26  GLUCOSE 94  --  94 102*  BUN 7  --  7 6  CREATININE 0.75  --  0.66 0.78  CALCIUM 8.9  --  8.6* 8.2*  MG  --  1.1*  --   --   PHOS  --  4.5  --  3.9   Liver Function Tests:  Recent Labs Lab 10/30/15 2019 10/31/15 0540 11/01/15 0455  AST 81* 61* 39  ALT 41 34 25  ALKPHOS 96 87 75  BILITOT 1.2 1.1 1.2  PROT 8.2* 7.3 7.0  ALBUMIN 2.8* 2.6* 2.4*   CBC:  Recent Labs Lab 10/30/15 2019 10/31/15 0455 11/01/15 0455  WBC 9.9 9.7 9.6  NEUTROABS 6.7 6.4 6.8  HGB 12.5* 13.1 12.6*  HCT 36.7* 38.0* 35.0*  MCV 78.4 78.7 76.1*  PLT 269 262 278   CBG: No results for input(s): GLUCAP in the last 168 hours.  Recent Results (from the past 240 hour(s))  Blood culture (routine x 2)     Status: None (Preliminary result)   Collection Time: 10/30/15  8:35 PM  Result Value Ref Range Status   Specimen Description BLOOD RIGHT ARM  Final   Special Requests BOTTLES DRAWN AEROBIC AND ANAEROBIC 5ML  Final   Culture   Final    NO GROWTH < 24 HOURS Performed [facility]    Report Status PENDING  Incomplete  Blood culture (routine x 2)     Status: None (Preliminary result)   Collection Time: 10/30/15 10:05 PM  Result Value Ref Range Status   Specimen Description BLOOD RIGHT  ANTECUBITAL  Final   Special Requests BOTTLES DRAWN AEROBIC AND ANAEROBIC 9 ML  Final   Culture   Final    NO GROWTH < 24 HOURS Performed at [facility]    Report Status PENDING  Incomplete     Studies: Dg Chest 2 View  10/30/2015  CLINICAL DATA:  Weakness in the legs. Bowel in urinary incontinence for year. EXAM: CHEST  2 VIEW COMPARISON:  09/14/2012 FINDINGS: New airspace infiltration with volume loss in the right upper lung. New airspace infiltration in the left mid lung. Changes likely represent pneumonia. Followup PA and lateral chest X-ray is recommended in 3-4 weeks following trial of antibiotic therapy to ensure resolution and exclude underlying malignancy. Heart size and pulmonary vascularity are normal. No blunting of costophrenic angles. No pneumothorax. Degenerative changes in the spine and shoulders. IMPRESSION: New airspace infiltration and volume loss in the right upper lung and new airspace infiltration in the left mid lung most likely represent multifocal pneumonia. Followup PA and lateral chest X-ray is recommended in 3-4 weeks following trial of antibiotic therapy to ensure resolution and exclude underlying malignancy. Electronically Signed   By: Burman NievesWilliam  Stevens M.D.   On: 10/30/2015 20:38    Scheduled Meds: . budesonide (PULMICORT) nebulizer solution  0.25 mg Nebulization BID  . cloNIDine  0.1 mg Oral BID  . folic acid  1 mg Oral Daily  . multivitamin with minerals  1 tablet Oral Daily  . potassium chloride  40 mEq Oral Q4H  . thiamine  100 mg Oral Daily   Continuous Infusions: . sodium chloride    . magnesium sulfate 1 - 4 g bolus IVPB    . piperacillin-tazobactam (ZOSYN)  IV Stopped (11/01/15 0306)  . vancomycin Stopped (10/31/15 2326)    Time spent: > 30 minutes   Vassie LollMadera, Solymar Grace  [facility] Pager [phone] If 7PM-7AM, please contact night-coverage at [email or web address], password [email or web address] 11/01/2015, 8:39 AM  LOS: 2 days

## 2015-11-02 DIAGNOSIS — R7989 Other specified abnormal findings of blood chemistry: Secondary | ICD-10-CM

## 2015-11-02 DIAGNOSIS — L8990 Pressure ulcer of unspecified site, unspecified stage: Secondary | ICD-10-CM | POA: Insufficient documentation

## 2015-11-02 DIAGNOSIS — F329 Major depressive disorder, single episode, unspecified: Secondary | ICD-10-CM

## 2015-11-02 LAB — LEGIONELLA PNEUMOPHILA SEROGP 1 UR AG: L. pneumophila Serogp 1 Ur Ag: NEGATIVE

## 2015-11-02 LAB — CBC
HCT: 33.9 % — ABNORMAL LOW (ref 39.0–52.0)
Hemoglobin: 11.9 g/dL — ABNORMAL LOW (ref 13.0–17.0)
MCH: 26.8 pg (ref 26.0–34.0)
MCHC: 35.1 g/dL (ref 30.0–36.0)
MCV: 76.4 fL — AB (ref 78.0–100.0)
PLATELETS: 294 10*3/uL (ref 150–400)
RBC: 4.44 MIL/uL (ref 4.22–5.81)
RDW: 13.8 % (ref 11.5–15.5)
WBC: 10.5 10*3/uL (ref 4.0–10.5)

## 2015-11-02 LAB — VITAMIN B12: Vitamin B-12: 356 pg/mL (ref 180–914)

## 2015-11-02 LAB — BASIC METABOLIC PANEL
ANION GAP: 10 (ref 5–15)
BUN: 6 mg/dL (ref 6–20)
CALCIUM: 8.4 mg/dL — AB (ref 8.9–10.3)
CO2: 24 mmol/L (ref 22–32)
Chloride: 99 mmol/L — ABNORMAL LOW (ref 101–111)
Creatinine, Ser: 0.64 mg/dL (ref 0.61–1.24)
Glucose, Bld: 101 mg/dL — ABNORMAL HIGH (ref 65–99)
POTASSIUM: 3.9 mmol/L (ref 3.5–5.1)
SODIUM: 133 mmol/L — AB (ref 135–145)

## 2015-11-02 LAB — MAGNESIUM: Magnesium: 1.2 mg/dL — ABNORMAL LOW (ref 1.7–2.4)

## 2015-11-02 MED ORDER — CHLORDIAZEPOXIDE HCL 5 MG PO CAPS
15.0000 mg | ORAL_CAPSULE | Freq: Three times a day (TID) | ORAL | Status: DC
  Administered 2015-11-02 – 2015-11-06 (×10): 15 mg via ORAL
  Filled 2015-11-02 (×11): qty 3

## 2015-11-02 MED ORDER — MAGNESIUM OXIDE 400 (241.3 MG) MG PO TABS
400.0000 mg | ORAL_TABLET | Freq: Two times a day (BID) | ORAL | Status: DC
  Administered 2015-11-02 – 2015-11-06 (×7): 400 mg via ORAL
  Filled 2015-11-02 (×8): qty 1

## 2015-11-02 MED ORDER — LORAZEPAM 2 MG/ML IJ SOLN
2.0000 mg | INTRAMUSCULAR | Status: DC | PRN
  Administered 2015-11-04: 2 mg via INTRAVENOUS
  Filled 2015-11-02: qty 1

## 2015-11-02 NOTE — Progress Notes (Addendum)
[facility] PROGRESS NOTE  [name] ZOX:[id number] DOB: 27-Dec-1955 DOA: 10/30/2015 PCP: No primary care provider on file.  Assessment/Plan:   CAP (community acquired pneumonia) -will continue current antibiotic regimen for another 24 hours or so, patient spiking fever now  -repeat CXR in 3-4 weeks to assure resolution of infiltrates -WBC reassuring; but spiking fever -PRN nebulizer, pulmicort BID and use flutter valve -continue supportive care -good O2 sat on RA    Alcohol dependence (HCC) and withdrawal  -cessation counseling provided -continue PRN ativan -start librium TID schedule -CIWA score 10-11 throughout the day (3/24)    Depression/ Anxiety -Continue CIWA protocol which will help with anxiety  -outpatient referral to psychiatry service -no SI or hallucinations     Hypertension - Place order for when necessary hydralazine - Urine drug screen negative for cocaine  - will continue treatment with clonidine BID (part of his HTN associated with withdrawal from alcohol most likely)    Anemia -of chronic disease and presumably associated with alcohol use  -B12 level WNL    Hyponatremia, hypokalemia and low magnesium  -Most likely due to history of alcoholism and PNA -NA improving with IVF's and essentially at normal limits now -continue monitoring and replacing electrolytes as needed     Abnormal LFTs -Could be related secondary to alcohol use -has trended down and is WNL now -will monitor intermittently     Hypoalbuminemia -associated with alcohol consumption and poor oral intake  -will use ensure BID    Pressure ulcer: sacral area, stage 1-2 -continue frequent mobilization/repositioning -barrier cream -preventive measures    Code Status: Full Family Communication: no family at bedside  Disposition Plan: will transition tx for withdrawal to librium, continue another 24 hours on current abx's (given spiking fever episode), assess capacity to  performed ADL's; continue supportive care.   Consultants:  None  Procedures:  None  Antibiotics:  Zosyn  Vancomycin  HPI/Subjective: Pt spike fever and continue experiencing withdrawal symptoms/episodes of confusion. Denies CP, orthopnea and SOB.   Objective: Filed Vitals:   11/02/15 1512 11/02/15 2107  BP: 138/82 157/99  Pulse: 128 128  Temp: 102 F (38.9 C) 100.1 F (37.8 C)  Resp: 20 20    Intake/Output Summary (Last 24 hours) at 11/02/15 2203 Last data filed at 11/02/15 2108  Gross per 24 hour  Intake   1495 ml  Output   2095 ml  Net   -600 ml   Filed Weights   10/31/15 0347 11/01/15 1500  Weight: 65.772 kg (145 lb) 61.5 kg (135 lb 9.3 oz)    Exam:   General:  Pt in nad, alert and awake, able to follow commands and cooperative. Still slightly confused and with intermittent episodes of impaired insight. Spike fever overnight; but denies CP and SOB  Cardiovascular: rrr, no mrg  Respiratory: good air movement bilaterally; slight exp wheezing, positive rhonchi, no crackles  Abdomen: soft, nt, nd  Musculoskeletal: no cyanosis or clubbing   Data Reviewed: Basic Metabolic Panel:  Recent Labs Lab 10/30/15 2019 10/31/15 0113 10/31/15 0540 11/01/15 0455 11/02/15 0440  NA 129*  --  131* 130* 133*  K 3.7  --  3.8 3.2* 3.9  CL 91*  --  95* 92* 99*  CO2 22  --  GLUCOSE 94  --  94 102* 101*  BUN 7  --  CREATININE 0.75  --  0.66 0.78 0.64  CALCIUM 8.9  --  8.6* 8.2* 8.4*  MG  --  1.1*  --   --  1.2*  PHOS  --  4.5  --  3.9  --    Liver Function Tests:  Recent Labs Lab 10/30/15 2019 10/31/15 0540 11/01/15 0455  AST 81* 61* 39  ALT 41 34 25  ALKPHOS 96 87 75  BILITOT 1.2 1.1 1.2  PROT 8.2* 7.3 7.0  ALBUMIN 2.8* 2.6* 2.4*   CBC:  Recent Labs Lab 10/30/15 2019 10/31/15 0455 11/01/15 0455 11/02/15 0440  WBC 9.9 9.7 9.6 10.5  NEUTROABS 6.7 6.4 6.8  --   HGB 12.5* 13.1 12.6* 11.9*  HCT 36.7* 38.0* 35.0* 33.9*  MCV  78.4 78.7 76.1* 76.4*  PLT 269 262 278 294   CBG: No results for input(s): GLUCAP in the last 168 hours.  Recent Results (from the past 240 hour(s))  Blood culture (routine x 2)     Status: None (Preliminary result)   Collection Time: 10/30/15  8:35 PM  Result Value Ref Range Status   Specimen Description BLOOD RIGHT ARM  Final   Special Requests BOTTLES DRAWN AEROBIC AND ANAEROBIC 5ML  Final   Culture   Final    NO GROWTH 3 DAYS Performed at [facility]    Report Status PENDING  Incomplete  Blood culture (routine x 2)     Status: None (Preliminary result)   Collection Time: 10/30/15 10:05 PM  Result Value Ref Range Status   Specimen Description BLOOD RIGHT ANTECUBITAL  Final   Special Requests BOTTLES DRAWN AEROBIC AND ANAEROBIC 9 ML  Final   Culture   Final    NO GROWTH 3 DAYS Performed at [facility]    Report Status PENDING  Incomplete  MRSA PCR Screening     Status: None   Collection Time: 11/01/15 10:20 AM  Result Value Ref Range Status   MRSA by PCR NEGATIVE NEGATIVE Final    Comment:        The GeneXpert MRSA Assay (FDA approved for NASAL specimens only), is one component of a comprehensive MRSA colonization surveillance program. It is not intended to diagnose MRSA infection nor to guide or monitor treatment for MRSA infections.      Studies: No results found.  Scheduled Meds: . budesonide (PULMICORT) nebulizer solution  0.25 mg Nebulization BID  . chlordiazePOXIDE  15 mg Oral TID  . cloNIDine  0.1 mg Oral BID  . feeding supplement (ENSURE ENLIVE)  237 mL Oral BID BM  . folic acid  1 mg Oral Daily  . multivitamin with minerals  1 tablet Oral Daily  . piperacillin-tazobactam (ZOSYN)  IV  3.375 g Intravenous 3 times per day  . thiamine  100 mg Oral Daily  . vancomycin  1,000 mg Intravenous Q8H   Continuous Infusions: . sodium chloride 75 mL/hr (11/02/15 1517)    Time spent: > 30 minutes   Vassie LollMadera, Romelle Reiley  [facility] Pager  [phone] If 7PM-7AM, please contact night-coverage at [email or web address], password [facility] 11/02/2015, 10:03 PM  LOS: 3 days

## 2015-11-03 MED ORDER — AMOXICILLIN-POT CLAVULANATE 875-125 MG PO TABS
1.0000 | ORAL_TABLET | Freq: Two times a day (BID) | ORAL | Status: DC
  Administered 2015-11-04 – 2015-11-06 (×4): 1 via ORAL
  Filled 2015-11-03 (×5): qty 1

## 2015-11-03 NOTE — Progress Notes (Signed)
OT Cancellation Note  Patient Details Name: [name] HedgeWillie Crimi MRN: [id number] DOB: [birth year]   Cancelled Treatment:    Reason Eval/Treat Not Completed: Other (comment).  Pt was confused with PT and unable to safely stand. Will check back tomorrow or Monday for OT  [facility],Maddex Garlitz 11/03/2015, 2:32 PM  Marica OtterMaryellen Tomeeka Plaugher, OTR/L [phone] 11/03/2015

## 2015-11-03 NOTE — Progress Notes (Signed)
This RN is taking over nursing care for the patient and agrees with the previous RN's assessment. Will continue to monitor.  Tonna Palazzi, RN 

## 2015-11-03 NOTE — Progress Notes (Signed)
[facility] PROGRESS NOTE  [name] HedgeWillie Shaffer ZOX:[id number] DOB: [birth year] DOA: 10/30/2015 PCP: No primary care provider on file.  Assessment/Plan:   CAP (community acquired pneumonia) -will start transitioning to PO Augmentin  -repeat CXR in 3-4 weeks to assure resolution of infiltrates -WBC reassuring; and with just low grade fever -PRN nebulizer, pulmicort BID and use flutter valve -continue supportive care and follow response -good O2 sat on RA    Alcohol dependence (HCC) and withdrawal  -cessation counseling provided -continue PRN ativan -will continue librium TID schedule -CIWA score 8-9 throughout the day (3/25)    Depression/ Anxiety -Continue CIWA protocol which will help with anxiety  -outpatient referral to psychiatry service -no SI or hallucinations     Hypertension - Place order for when necessary hydralazine - Urine drug screen negative for cocaine  - will continue treatment with clonidine BID (part of his HTN associated with withdrawal from alcohol most likely)    Anemia -of chronic disease and presumably associated with alcohol use  -B12 level WNL    Hyponatremia, hypokalemia and low magnesium  -Most likely due to history of alcoholism and PNA -NA improving with IVF's and essentially at normal limits now -continue monitoring and replacing electrolytes as needed     Abnormal LFTs -Could be related secondary to alcohol use -has trended down and is WNL now -will monitor intermittently     Hypoalbuminemia -associated with alcohol consumption and poor oral intake  -will use ensure BID    Pressure ulcer: sacral area, stage 1-2 -continue frequent mobilization/repositioning -barrier cream -preventive measures       Physical deconditioning -will needs SNF as per PT staff evaluation/rec's -SW consulted   Code Status: Full Family Communication: no family at bedside  Disposition Plan: will continue tx for withdrawal with librium, transition abx's  to PO. Will need SNF. Continue supportive care.   Consultants:  None  Procedures:  None  Antibiotics:  Zosyn  Vancomycin   Augmentin 3/26  HPI/Subjective: Pt with low grade temp. With improvement on his withdrawal symptoms, but still slightly confused. Denies CP, orthopnea and SOB.   Objective: Filed Vitals:   11/03/15 0450 11/03/15 1438  BP: 143/85 146/83  Pulse: 111 112  Temp: 99.2 F (37.3 C) 99.9 F (37.7 C)  Resp: 26 20    Intake/Output Summary (Last 24 hours) at 11/03/15 1936 Last data filed at 11/03/15 1905  Gross per 24 hour  Intake 2003.75 ml  Output   2060 ml  Net -56.25 ml   Filed Weights   10/31/15 0347 11/01/15 1500  Weight: 65.772 kg (145 lb) 61.5 kg (135 lb 9.3 oz)    Exam:   General:  Pt in nad, alert and awake, able to follow commands and cooperative. Still slightly confused/somnolent. Found by PT staff to required SNF for rehab. Denies CP and SOB. Low grade temp appreciated on vital signs records.   Cardiovascular: rrr, no mrg  Respiratory: good air movement bilaterally; slight exp wheezing, positive rhonchi, no crackles  Abdomen: soft, nt, nd  Musculoskeletal: no cyanosis or clubbing   Data Reviewed: Basic Metabolic Panel:  Recent Labs Lab 10/30/15 2019 10/31/15 0113 10/31/15 0540 11/01/15 0455 11/02/15 0440  NA 129*  --  131* 130* 133*  K 3.7  --  3.8 3.2* 3.9  CL 91*  --  95* 92* 99*  CO2 22  --  23 26 24   GLUCOSE 94  --  94 102* 101*  BUN 7  --  7 6 6  CREATININE 0.75  --  0.66 0.78 0.64  CALCIUM 8.9  --  8.6* 8.2* 8.4*  MG  --  1.1*  --   --  1.2*  PHOS  --  4.5  --  3.9  --    Liver Function Tests:  Recent Labs Lab 10/30/15 2019 10/31/15 0540 11/01/15 0455  AST 81* 61* 39  ALT 41 34 25  ALKPHOS 96 87 75  BILITOT 1.2 1.1 1.2  PROT 8.2* 7.3 7.0  ALBUMIN 2.8* 2.6* 2.4*   CBC:  Recent Labs Lab 10/30/15 2019 10/31/15 0455 11/01/15 0455 11/02/15 0440  WBC 9.9 9.7 9.6 10.5  NEUTROABS 6.7 6.4 6.8  --    HGB 12.5* 13.1 12.6* 11.9*  HCT 36.7* 38.0* 35.0* 33.9*  MCV 78.4 78.7 76.1* 76.4*  PLT 269 262 278 294   CBG: No results for input(s): GLUCAP in the last 168 hours.  Recent Results (from the past 240 hour(s))  Blood culture (routine x 2)     Status: None (Preliminary result)   Collection Time: 10/30/15  8:35 PM  Result Value Ref Range Status   Specimen Description BLOOD RIGHT ARM  Final   Special Requests BOTTLES DRAWN AEROBIC AND ANAEROBIC  Final   Culture   Final    NO GROWTH 4 DAYS Performed at [facility]    Report Status PENDING  Incomplete  Blood culture (routine x 2)     Status: None (Preliminary result)   Collection Time: 10/30/15 10:05 PM  Result Value Ref Range Status   Specimen Description BLOOD RIGHT ANTECUBITAL  Final   Special Requests BOTTLES DRAWN AEROBIC AND ANAEROBIC 9 ML  Final   Culture   Final    NO GROWTH 4 DAYS Performed at [facility]    Report Status PENDING  Incomplete  MRSA PCR Screening     Status: None   Collection Time: 11/01/15 10:20 AM  Result Value Ref Range Status   MRSA by PCR NEGATIVE NEGATIVE Final    Comment:        The GeneXpert MRSA Assay (FDA approved for NASAL specimens only), is one component of a comprehensive MRSA colonization surveillance program. It is not intended to diagnose MRSA infection nor to guide or monitor treatment for MRSA infections.      Studies: No results found.  Scheduled Meds: . budesonide (PULMICORT) nebulizer solution  0.25 mg Nebulization BID  . chlordiazePOXIDE  15 mg Oral TID  . cloNIDine  0.1 mg Oral BID  . feeding supplement (ENSURE ENLIVE)  237 mL Oral BID BM  . folic acid  1 mg Oral Daily  . magnesium oxide  400 mg Oral BID  . multivitamin with minerals  1 tablet Oral Daily  . piperacillin-tazobactam (ZOSYN)  IV  3.375 g Intravenous 3 times per day  . thiamine  100 mg Oral Daily  . vancomycin  1,000 mg Intravenous Q8H   Continuous Infusions: . sodium chloride  75 mL/hr (11/02/15 1517)    Time spent: > 30 minutes   Vassie Loll  [facility] Pager [phone] If 7PM-7AM, please contact night-coverage at [email or web address], password [facility] 11/03/2015, 7:36 PM  LOS: 4 days

## 2015-11-03 NOTE — Evaluation (Signed)
Physical Therapy Evaluation Patient Details Name: [name] MRN: [id number] DOB: [birth year] Today's Date: 11/03/2015   History of Present Illness  Pt is a 60 year old male with hx of anxiety, depression, acute encephalopathy, alcohol dependence and admitted for CAP and alcohol dependence with withdrawal  Clinical Impression  Pt admitted with above diagnosis. Pt currently with functional limitations due to the deficits listed below (see PT Problem List).  Pt requiring increased assist for bed mobility today and unable to stand/transfer at this time. Pt will benefit from skilled PT to increase their independence and safety with mobility to allow discharge to the venue listed below.  Pt poor historian.  Would benefit from SNF upon d/c.     Follow Up Recommendations SNF;Supervision/Assistance - 24 hour    Equipment Recommendations  None recommended by PT    Recommendations for Other Services       Precautions / Restrictions Precautions Precautions: Fall      Mobility  Bed Mobility Overal bed mobility: Needs Assistance Bed Mobility: Supine to Sit;Sit to Supine;Rolling Rolling: Total assist   Supine to sit: Max assist Sit to supine: Mod assist   General bed mobility comments: pt assisted his R LE over EOB however required assist for L LE and trunk upright, assist for LEs onto bed upon return to supine, [facility] and required total assist to roll to place bed pan (RN and NT aware pt left on bed pan)  Transfers                 General transfer comment: attempted however pt unable to assist  Ambulation/Gait                Stairs            Wheelchair Mobility    Modified Rankin (Stroke Patients Only)       Balance Overall balance assessment: Needs assistance Sitting-balance support: Single extremity supported;Feet supported Sitting balance-Leahy Scale: Poor Sitting balance - Comments: requiring UE support                                      Pertinent Vitals/Pain Pain Assessment: No/denies pain    Home Living Family/patient expects to be discharged to:: Private residence                 Additional Comments: pt poor historian    Prior Function Level of Independence: Independent               Hand Dominance        Extremity/Trunk Assessment   Upper Extremity Assessment: Generalized weakness           Lower Extremity Assessment: Generalized weakness         Communication   Communication: No difficulties  Cognition Arousal/Alertness: Awake/alert Behavior During Therapy: WFL for tasks assessed/performed Overall Cognitive Status: No family/caregiver present to determine baseline cognitive functioning Area of Impairment: Following commands       Following Commands: Follows one step commands inconsistently       General Comments: pt appears confused, does attempt to follow commands    General Comments      Exercises        Assessment/Plan    PT Assessment Patient needs continued PT services  PT Diagnosis Difficulty walking;Generalized weakness   PT Problem List Decreased strength;Decreased activity tolerance;Decreased mobility;Decreased balance;Decreased safety awareness;Decreased knowledge of  use of DME  PT Treatment Interventions Gait training;DME instruction;Balance training;Functional mobility training;Patient/family education;Therapeutic activities;Therapeutic exercise   PT Goals (Current goals can be found in the Care Plan section) Acute Rehab PT Goals PT Goal Formulation: With patient Time For Goal Achievement: 11/17/15 Potential to Achieve Goals: Good    Frequency Min 3X/week   Barriers to discharge        Co-evaluation               End of Session   Activity Tolerance: Patient limited by fatigue Patient left: in bed;with call bell/phone within reach;with bed alarm set Nurse Communication: Mobility status (NT and RN aware pt left  on bed pan)         Time: 2956-2130 PT Time Calculation (min) (ACUTE ONLY): 15 min   Charges:   PT Evaluation $PT Eval Moderate Complexity: 1 Procedure     PT G Codes:        Chalsea Darko,KATHrine E 11/03/2015, 1:59 PM Zenovia Jarred, PT, DPT 11/03/2015 Pager: [phone]

## 2015-11-04 DIAGNOSIS — R5381 Other malaise: Secondary | ICD-10-CM

## 2015-11-04 LAB — BASIC METABOLIC PANEL
ANION GAP: 11 (ref 5–15)
BUN: 5 mg/dL — AB (ref 6–20)
CALCIUM: 8.1 mg/dL — AB (ref 8.9–10.3)
CO2: 24 mmol/L (ref 22–32)
Chloride: 95 mmol/L — ABNORMAL LOW (ref 101–111)
Creatinine, Ser: 0.59 mg/dL — ABNORMAL LOW (ref 0.61–1.24)
GFR calc Af Amer: >60 mL/min (ref >60)
GLUCOSE: 104 mg/dL — AB (ref 65–99)
Potassium: 3.2 mmol/L — ABNORMAL LOW (ref 3.5–5.1)
SODIUM: 130 mmol/L — AB (ref 135–145)

## 2015-11-04 LAB — CULTURE, BLOOD (ROUTINE X 2)
Culture: NO GROWTH
Culture: NO GROWTH

## 2015-11-04 LAB — MAGNESIUM: MAGNESIUM: 1.2 mg/dL — AB (ref 1.7–2.4)

## 2015-11-04 MED ORDER — ACETAMINOPHEN 325 MG PO TABS
650.0000 mg | ORAL_TABLET | ORAL | Status: DC | PRN
  Filled 2015-11-04: qty 2

## 2015-11-04 MED ORDER — POTASSIUM CHLORIDE CRYS ER 20 MEQ PO TBCR
40.0000 meq | EXTENDED_RELEASE_TABLET | ORAL | Status: AC
  Administered 2015-11-04 (×2): 40 meq via ORAL
  Filled 2015-11-04 (×2): qty 2

## 2015-11-04 MED ORDER — ACETAMINOPHEN 650 MG RE SUPP
650.0000 mg | RECTAL | Status: DC | PRN
  Administered 2015-11-04: 650 mg via RECTAL
  Filled 2015-11-04: qty 1

## 2015-11-04 MED ORDER — SODIUM CHLORIDE 0.9 % IV BOLUS (SEPSIS)
500.0000 mL | Freq: Once | INTRAVENOUS | Status: AC
  Administered 2015-11-04: 500 mL via INTRAVENOUS

## 2015-11-04 NOTE — NC FL2 (Signed)
[place] MEDICAID FL2 LEVEL OF CARE SCREENING TOOL     IDENTIFICATION  Patient Name: [name] Birthdate: 01-29-1956 Sex: male Admission Date (Current Location): 10/30/2015  [facility] and [place] Number:  Producer, television/film/video and Address:  [facility],  [address], [place] [address]      Provider Number: [id number]  Attending Physician Name and Address:  Vassie Loll, MD  Relative Name and Phone Number:       Current Level of Care: Hospital Recommended Level of Care: Skilled Nursing Facility Prior Approval Number:    Date Approved/Denied:   PASRR Number:    Discharge Plan: SNF    Current Diagnoses: Patient Active Problem List   Diagnosis Date Noted  . Pressure ulcer 11/02/2015  . CAP (community acquired pneumonia) 10/30/2015  . Hypertension 10/30/2015  . Anemia 10/30/2015  . Hyponatremia 10/30/2015  . Abnormal LFTs 10/30/2015  . Hypoalbuminemia 10/30/2015  . Acute encephalopathy 09/14/2012  . Seizures (HCC) 09/14/2012  . Hypoglycemia 09/14/2012  . Hypokalemia 09/14/2012  . Alcohol dependence (HCC) 09/14/2012  . Leukocytosis 09/14/2012  . Depression 09/14/2012  . Anxiety 09/14/2012  . Fracture of rib, closed 09/14/2012    Orientation RESPIRATION BLADDER Height & Weight     Self, Time, Situation, Place  Normal Continent Weight: 61.5 kg (135 lb 9.3 oz) Height:   (180.3 cm)  BEHAVIORAL SYMPTOMS/MOOD NEUROLOGICAL BOWEL NUTRITION STATUS   (NONE)  (NONE) Incontinent Diet (Heart healthy)  AMBULATORY STATUS COMMUNICATION OF NEEDS Skin   Extensive Assist Verbally Normal                       Personal Care Assistance Level of Assistance  Bathing, Feeding, Dressing Bathing Assistance: Limited assistance Feeding assistance: Independent Dressing Assistance: Limited assistance     Functional Limitations Info  Sight, Hearing, Speech Sight Info: Adequate Hearing Info: Adequate Speech Info: Adequate    SPECIAL CARE FACTORS  FREQUENCY  PT (By licensed PT), OT (By licensed OT)     PT Frequency: 5/week OT Frequency: 5/week            Contractures Contractures Info: Not present    Additional Factors Info  Allergies, Code Status Code Status Info: Full Allergies Info: NKDA           Current Medications (11/04/2015):  This is the current hospital active medication list Current Facility-Administered Medications  Medication Dose Route Frequency Provider Last Rate Last Dose  . 0.9 %  sodium chloride infusion   Intravenous Continuous Vassie Loll, MD 75 mL/hr at 11/04/15 1555    . amoxicillin-clavulanate (AUGMENTIN) 875-125 MG per tablet 1 tablet  1 tablet Oral Q12H Vassie Loll, MD   1 tablet at 11/04/15 1106  . budesonide (PULMICORT) nebulizer solution 0.25 mg  0.25 mg Nebulization BID Vassie Loll, MD   0.25 mg at 11/03/15 2114  . chlordiazePOXIDE (LIBRIUM) capsule 15 mg  15 mg Oral TID Vassie Loll, MD   15 mg at 11/04/15 1105  . cloNIDine (CATAPRES) tablet 0.1 mg  0.1 mg Oral BID Vassie Loll, MD   0.1 mg at 11/04/15 1107  . feeding supplement (ENSURE ENLIVE) (ENSURE ENLIVE) liquid 237 mL  237 mL Oral BID BM Vassie Loll, MD   237 mL at 11/04/15 1546  . folic acid (FOLVITE) tablet 1 mg  1 mg Oral Daily Bobette Mo, MD   1 mg at 11/04/15 1106  . hydrALAZINE (APRESOLINE) injection 5 mg  5 mg Intravenous Q6H PRN Pamala Hurry  Cena BentonVega, MD   5 mg at 10/31/15 1753  . ipratropium-albuterol (DUONEB) 0.5-2.5 (3) MG/3ML nebulizer solution 3 mL  3 mL Nebulization Q6H PRN Bobette Moavid Manuel Ortiz, MD      . LORazepam (ATIVAN) injection 2 mg  2 mg Intravenous Q4H PRN Vassie Lollarlos Madera, MD      . magnesium oxide (MAG-OX) tablet 400 mg  400 mg Oral BID Vassie Lollarlos Madera, MD   400 mg at 11/04/15 1105  . multivitamin with minerals tablet 1 tablet  1 tablet Oral Daily Bobette Moavid Manuel Ortiz, MD   1 tablet at 11/04/15 1106  . potassium chloride SA (K-DUR,KLOR-CON) CR tablet 40 mEq  40 mEq Oral Q4H Vassie Lollarlos Madera, MD   40 mEq at 11/04/15  1547  . thiamine (VITAMIN B-1) tablet 100 mg  100 mg Oral Daily Bobette Moavid Manuel Ortiz, MD   100 mg at 11/04/15 1106     Discharge Medications: Please see discharge summary for a list of discharge medications.  Relevant Imaging Results:  Relevant Lab Results:   Additional Information SSN: [id number]  Venita LickCampbell, [name], LCSW

## 2015-11-04 NOTE — Progress Notes (Signed)
[facility] PROGRESS NOTE  [name] ZOX:[id number] DOB: April 21, 1956 DOA: 10/30/2015 PCP: No primary care provider on file.  Assessment/Plan:   CAP (community acquired pneumonia) -will continue PO Augmentin now -repeat CXR in 3-4 weeks to assure resolution of infiltrates -WBC reassuring; and patient is afebrile  -PRN nebulizer, pulmicort BID and use flutter valve -continue supportive care and follow response -good O2 sat on RA    Alcohol dependence (HCC) and withdrawal  -cessation counseling provided -continue PRN ativan -will continue librium TID schedule -CIWA score 8-9 throughout the day (3/25)    Depression/ Anxiety -Continue CIWA protocol which will help with anxiety  -outpatient referral to psychiatry service -no SI or hallucinations     Hypertension - Place order for when necessary hydralazine - Urine drug screen negative for cocaine  - will continue treatment with clonidine BID (part of his HTN associated with withdrawal from alcohol most likely)    Anemia -of chronic disease and presumably associated with alcohol use  -B12 level WNL    Hyponatremia, hypokalemia and low magnesium  -Most likely due to history of alcoholism and PNA -NA improving with IVF's and essentially at normal limits now -continue monitoring and replacing electrolytes as needed     Abnormal LFTs -Could be related secondary to alcohol use -has trended down and is WNL now -will monitor intermittently     Hypoalbuminemia -associated with alcohol consumption and poor oral intake  -will use ensure BID    Pressure ulcer: sacral area, stage 1-2 -continue frequent mobilization/repositioning -barrier cream -preventive measures       Physical deconditioning -will needs SNF as per PT staff evaluation/rec's -SW consulted   Code Status: Full Family Communication: no family at bedside  Disposition Plan: will continue tx for withdrawal with librium, transition abx's to PO. Will need  SNF. Continue supportive care.   Consultants:  None  Procedures:  None  Antibiotics:  Zosyn  Vancomycin   Augmentin 3/26  HPI/Subjective: Pt with low grade temp. With improvement on his withdrawal symptoms and less confused. Denies CP, orthopnea and SOB.   Objective: Filed Vitals:   11/04/15 0612 11/04/15 1222  BP: 157/94 165/87  Pulse: 103 118  Temp: 98.5 F (36.9 C) 98.2 F (36.8 C)  Resp: 18 24    Intake/Output Summary (Last 24 hours) at 11/04/15 1310 Last data filed at 11/04/15 1250  Gross per 24 hour  Intake   3395 ml  Output   3500 ml  Net   -105 ml   Filed Weights   10/31/15 0347 11/01/15 1500  Weight: 65.772 kg (145 lb) 61.5 kg (135 lb 9.3 oz)    Exam:   General:  Pt in nad, alert and awake, able to follow commands and cooperative. Still slightly confused/somnolent. Found by PT staff to required SNF for rehab. Denies CP and SOB. Low grade temp appreciated on vital signs records.   Cardiovascular: rrr, no mrg  Respiratory: good air movement bilaterally; slight exp wheezing, positive rhonchi, no crackles  Abdomen: soft, nt, nd  Musculoskeletal: no cyanosis or clubbing   Data Reviewed: Basic Metabolic Panel:  Recent Labs Lab 10/30/15 2019 10/31/15 0113 10/31/15 0540 11/01/15 0455 11/02/15 0440 11/04/15 0414  NA 129*  --  131* 130* 133* 130*  K 3.7  --  3.8 3.2* 3.9 3.2*  CL 91*  --  95* 92* 99* 95*  CO2 22  --  GLUCOSE 94  --  94 102* 101* 104*  BUN 7  --  7 6 6  5*  CREATININE 0.75  --  0.66 0.78 0.64 0.59*  CALCIUM 8.9  --  8.6* 8.2* 8.4* 8.1*  MG  --  1.1*  --   --  1.2* 1.2*  PHOS  --  4.5  --  3.9  --   --    Liver Function Tests:  Recent Labs Lab 10/30/15 2019 10/31/15 0540 11/01/15 0455  AST 81* 61* 39  ALT 41 34 25  ALKPHOS 96 87 75  BILITOT 1.2 1.1 1.2  PROT 8.2* 7.3 7.0  ALBUMIN 2.8* 2.6* 2.4*   CBC:  Recent Labs Lab 10/30/15 2019 10/31/15 0455 11/01/15 0455 11/02/15 0440  WBC 9.9 9.7 9.6  10.5  NEUTROABS 6.7 6.4 6.8  --   HGB 12.5* 13.1 12.6* 11.9*  HCT 36.7* 38.0* 35.0* 33.9*  MCV 78.4 78.7 76.1* 76.4*  PLT 269 262 278 294   CBG: No results for input(s): GLUCAP in the last 168 hours.  Recent Results (from the past 240 hour(s))  Blood culture (routine x 2)     Status: None (Preliminary result)   Collection Time: 10/30/15  8:35 PM  Result Value Ref Range Status   Specimen Description BLOOD RIGHT ARM  Final   Special Requests BOTTLES DRAWN AEROBIC AND ANAEROBIC 5ML  Final   Culture   Final    NO GROWTH 4 DAYS Performed [facility]    Report Status PENDING  Incomplete  Blood culture (routine x 2)     Status: None (Preliminary result)   Collection Time: 10/30/15 10:05 PM  Result Value Ref Range Status   Specimen Description BLOOD RIGHT ANTECUBITAL  Final   Special Requests BOTTLES DRAWN AEROBIC AND ANAEROBIC 9 ML  Final   Culture   Final    NO GROWTH 4 DAYS Performed at [facility]    Report Status PENDING  Incomplete  MRSA PCR Screening     Status: None   Collection Time: 11/01/15 10:20 AM  Result Value Ref Range Status   MRSA by PCR NEGATIVE NEGATIVE Final    Comment:        The GeneXpert MRSA Assay (FDA approved for NASAL specimens only), is one component of a comprehensive MRSA colonization surveillance program. It is not intended to diagnose MRSA infection nor to guide or monitor treatment for MRSA infections.      Studies: No results found.  Scheduled Meds: . amoxicillin-clavulanate  1 tablet Oral Q12H  . budesonide (PULMICORT) nebulizer solution  0.25 mg Nebulization BID  . chlordiazePOXIDE  15 mg Oral TID  . cloNIDine  0.1 mg Oral BID  . feeding supplement (ENSURE ENLIVE)  237 mL Oral BID BM  . folic acid  1 mg Oral Daily  . magnesium oxide  400 mg Oral BID  . multivitamin with minerals  1 tablet Oral Daily  . potassium chloride  40 mEq Oral Q4H  . thiamine  100 mg Oral Daily   Continuous Infusions: . sodium  chloride 75 mL/hr at 11/04/15 0033    Time spent: > 30 minutes   Vassie LollMadera, Mikeria Valin  [facility] Pager [phone] If 7PM-7AM, please contact night-coverage at [email or web address], password [email or web address] 11/04/2015, 1:10 PM  LOS: 5 days

## 2015-11-05 ENCOUNTER — Inpatient Hospital Stay (HOSPITAL_COMMUNITY): Payer: MEDICAID

## 2015-11-05 DIAGNOSIS — E43 Unspecified severe protein-calorie malnutrition: Secondary | ICD-10-CM | POA: Insufficient documentation

## 2015-11-05 DIAGNOSIS — R29898 Other symptoms and signs involving the musculoskeletal system: Secondary | ICD-10-CM

## 2015-11-05 LAB — BASIC METABOLIC PANEL
ANION GAP: 11 (ref 5–15)
BUN: 7 mg/dL (ref 6–20)
CHLORIDE: 100 mmol/L — AB (ref 101–111)
CO2: 24 mmol/L (ref 22–32)
Calcium: 8.3 mg/dL — ABNORMAL LOW (ref 8.9–10.3)
Creatinine, Ser: 0.62 mg/dL (ref 0.61–1.24)
GFR calc Af Amer: >60 mL/min (ref >60)
Glucose, Bld: 101 mg/dL — ABNORMAL HIGH (ref 65–99)
POTASSIUM: 3.9 mmol/L (ref 3.5–5.1)
SODIUM: 135 mmol/L (ref 135–145)

## 2015-11-05 NOTE — Progress Notes (Signed)
[facility] PROGRESS NOTE  [name] ZOX:[id number] DOB: 1955/10/16 DOA: 10/30/2015 PCP: No primary care provider on file.  Assessment/Plan:   CAP (community acquired pneumonia) -will continue PO Augmentin now and complete therapy; last day of antibiotics anticipated 3/30 -repeat CXR in 3-4 weeks to assure resolution of infiltrates -WBC reassuring; and patient is afebrile  -PRN nebulizer, pulmicort BID and use flutter valve -continue supportive care and follow response -good O2 sat on RA    Alcohol dependence (HCC) and withdrawal  -cessation counseling provided -continue PRN ativan -will continue librium TID schedule -CIWA score around 7-8 now    Depression/ Anxiety -Continue CIWA protocol which will help with anxiety  -outpatient referral to psychiatry service -no SI or hallucinations     Hypertension - Place order for when necessary hydralazine - Urine drug screen negative for cocaine  - will continue treatment with clonidine BID (part of his HTN associated with withdrawal from alcohol most likely)    Anemia -of chronic disease and presumably associated with alcohol use  -B12 level WNL    Hyponatremia, hypokalemia and low magnesium  -Most likely due to history of alcoholism and PNA -NA improved with IVF's and essentially at normal limits now -continue monitoring and replacing electrolytes as needed     Abnormal LFTs -Could be related secondary to alcohol use -has trended down and is WNL now -will monitor intermittently     Hypoalbuminemia and severe protein calorie malnutrition  -associated with alcohol consumption and poor oral intake  -will use ensure BID    Pressure ulcer: sacral area, stage 1-2 -continue frequent mobilization/repositioning -barrier cream and preventive measures       Physical deconditioning -will needs SNF as per [name] staff evaluation/rec's -SW consulted and searching for facility      Left arm weakness -no other focal deficit  appreciated -unclear if related to current physical deconditioning, ?? CVA, or alcoholic neuropathy   -will check CT head   Code Status: Full Family Communication: no family at bedside  Disposition Plan: will continue tx for withdrawal with librium, continue abx's by mouth. Will need SNF. Continue supportive care and check CT head.   Consultants:  None  Procedures:  None  Antibiotics:  Zosyn  Vancomycin   Augmentin 3/26  HPI/Subjective: [name] currently afebrile. Not eating much and reporting difficulty moving his left arm. No other focal deficit appreciated. Denies  CP, SOB and orthopnea.  Objective: Filed Vitals:   11/05/15 1057 11/05/15 1320  BP: 166/104 137/77  Pulse: 116 120  Temp:  97.4 F (36.3 C)  Resp:  20    Intake/Output Summary (Last 24 hours) at 11/05/15 1445 Last data filed at 11/05/15 1004  Gross per 24 hour  Intake   2040 ml  Output   1450 ml  Net    590 ml   Filed Weights   10/31/15 0347 11/01/15 1500  Weight: 65.772 kg (145 lb) 61.5 kg (135 lb 9.3 oz)    Exam:   General:  [name] in nad, alert and awake, able to follow commands and cooperative with exam. Still slightly confused/somnolent, but easy to arouse. Found by [name] staff to required SNF for rehab. Denies CP and SOB. Low grade temp appreciated on vital signs records.   Cardiovascular: rrr, no mrg  Respiratory: good air movement bilaterally; slight exp wheezing, positive rhonchi, no crackles  Abdomen: soft, nt, nd  Musculoskeletal: no cyanosis or clubbing   Data Reviewed: Basic Metabolic Panel:  Recent Labs Lab 10/31/15 0113 10/31/15 0540  11/01/15 0455 11/02/15 0440 11/04/15 0414 11/05/15 0459  NA  --  131* 130* 133* 130* 135  K  --  3.8 3.2* 3.9 3.2* 3.9  CL  --  95* 92* 99* 95* 100*  CO2  --  23 26 24 24 24   GLUCOSE  --  94 102* 101* 104* 101*  BUN  --  7 6 6  5* 7  CREATININE  --  0.66 0.78 0.64 0.59* 0.62  CALCIUM  --  8.6* 8.2* 8.4* 8.1* 8.3*  MG 1.1*  --   --  1.2*  1.2*  --   PHOS 4.5  --  3.9  --   --   --    Liver Function Tests:  Recent Labs Lab 10/30/15 2019 10/31/15 0540 11/01/15 0455  AST 81* 61* 39  ALT 41 34 25  ALKPHOS 96 87 75  BILITOT 1.2 1.1 1.2  PROT 8.2* 7.3 7.0  ALBUMIN 2.8* 2.6* 2.4*   CBC:  Recent Labs Lab 10/30/15 2019 10/31/15 0455 11/01/15 0455 11/02/15 0440  WBC 9.9 9.7 9.6 10.5  NEUTROABS 6.7 6.4 6.8  --   HGB 12.5* 13.1 12.6* 11.9*  HCT 36.7* 38.0* 35.0* 33.9*  MCV 78.4 78.7 76.1* 76.4*  PLT 269 262 278 294   CBG: No results for input(s): GLUCAP in the last 168 hours.  Recent Results (from the past 240 hour(s))  Blood culture (routine x 2)     Status: None   Collection Time: 10/30/15  8:35 PM  Result Value Ref Range Status   Specimen Description BLOOD RIGHT ARM  Final   Special Requests BOTTLES DRAWN AEROBIC AND ANAEROBIC 5ML  Final   Culture   Final    NO GROWTH 5 DAYS Performed [facility]    Report Status 11/04/2015 FINAL  Final  Blood culture (routine x 2)     Status: None   Collection Time: 10/30/15 10:05 PM  Result Value Ref Range Status   Specimen Description BLOOD RIGHT ANTECUBITAL  Final   Special Requests BOTTLES DRAWN AEROBIC AND ANAEROBIC 9 ML  Final   Culture   Final    NO GROWTH 5 DAYS [facility]formed [facility]    Report Status 11/04/2015 FINAL  Final  MRSA PCR Screening     Status: None   Collection Time: 11/01/15 10:20 AM  Result Value Ref Range Status   MRSA by PCR NEGATIVE NEGATIVE Final    Comment:        The GeneXpert MRSA Assay (FDA approved for NASAL specimens only), is one component of a comprehensive MRSA colonization surveillance program. It is not intended to diagnose MRSA infection nor to guide or monitor treatment for MRSA infections.      Studies: No results found.  Scheduled Meds: . amoxicillin-clavulanate  1 tablet Oral Q12H  . budesonide (PULMICORT) nebulizer solution  0.25 mg Nebulization BID  . chlordiazePOXIDE  15 mg Oral  TID  . cloNIDine  0.1 mg Oral BID  . feeding supplement (ENSURE ENLIVE)  237 mL Oral BID BM  . folic acid  1 mg Oral Daily  . magnesium oxide  400 mg Oral BID  . multivitamin with minerals  1 tablet Oral Daily  . thiamine  100 mg Oral Daily   Continuous Infusions: . sodium chloride 75 mL/hr at 11/05/15 1212    Time spent: > 30 minutes   Vassie LollMadera, Kayliah Tindol  [facility] Pager [phone] If 7PM-7AM, please contact night-coverage at [email or web address], password [email or web address] 11/05/2015, 2:45  PM  LOS: 6 days

## 2015-11-05 NOTE — Clinical Social Work Placement (Signed)
   CLINICAL SOCIAL WORK PLACEMENT  NOTE  Date:  11/05/2015  Patient Details  Name: [name] MRN: [id number] Date of Birth: 08-15-1955  Clinical Social Work is seeking post-discharge placement for this patient at the Skilled  Nursing Facility level of care (*CSW will initial, date and re-position this form in  chart as items are completed):  Yes   Patient/family provided with [facility] Clinical Social Work Department's list of facilities offering this level of care within the geographic area requested by the patient (or if unable, by the patient's family).  Yes   Patient/family informed of their freedom to choose among providers that offer the needed level of care, that participate in Medicare, Medicaid or managed care program needed by the patient, have an available bed and are willing to accept the patient.  Yes   Patient/family informed of [facility]'s ownership interest in [facility] and [facility], as well as of the fact that they are under no obligation to receive care at these facilities.  PASRR submitted to EDS on 11/05/15     PASRR number received on       Existing PASRR number confirmed on       FL2 transmitted to all facilities in geographic area requested by pt/family on 11/05/15     FL2 transmitted to all facilities within larger geographic area on 11/05/15     Patient informed that his/her managed care company has contracts with or will negotiate with certain facilities, including the following:            Patient/family informed of bed offers received.  Patient chooses bed at       Physician recommends and patient chooses bed at      Patient to be transferred to   on  .  Patient to be transferred to facility by       Patient family notified on   of transfer.  Name of family member notified:        PHYSICIAN       Additional Comment:    _______________________________________________ Scharlene Glossallie Jaquisha Frech, Student-SW 11/05/2015, 12:18 PM

## 2015-11-05 NOTE — Progress Notes (Signed)
Physical Therapy Treatment Patient Details Name: [name] MRN: [id number] DOB: 11/22/1955 Today's Date: 11/05/2015    History of Present Illness Pt is a 60 year old male with hx of anxiety, depression, acute encephalopathy, alcohol dependence and admitted for CAP and alcohol dependence with withdrawal    PT Comments    Pt in bed with B mittens difficult to arouse.  Slow to answer and difficulty finding his words.  Responds to name only.  Follows repeat/direct commands inconsistently.   [facility] required + 2 total/max assist.  Poor sitting balance with severe posterior lean.  Repeat instruction to attempt orientation failed.  Attempted amb however unable to take functional steps.  B feet "planted" to floor.  + 2 assist to recliner and positioned to comfort.  RN and NT in room and instructed on transfer level.   Follow Up Recommendations  SNF     Equipment Recommendations       Recommendations for Other Services       Precautions / Restrictions Precautions Precautions: Fall Precaution Comments: AMS Restrictions Weight Bearing Restrictions: No    Mobility  Bed Mobility Overal bed mobility: Needs Assistance Bed Mobility: Supine to Sit     Supine to sit: Max assist;Total assist     General bed mobility comments: repeat VC's needed to stay on task.  Great difficulty self performing with noted weakness/decreased use L UE.  Poor sitting balance EOB with severe R/posterior lean.  Pt unable to self right to midline.    Transfers Overall transfer level: Needs assistance Equipment used: None Transfers: Sit to/from [employer];Stand Pivot Transfers Sit to Stand: Total assist;+2 safety/equipment Stand pivot transfers: Total assist;+2 safety/equipment       General transfer comment: 100% VC's for direction and hand over hand cueing to assist from [facility]OB to [facility].  Incomplete turns and difficulty stepping.  Feet "planted" to floor.  Severe posterior lean.     Ambulation/Gait             General Gait Details: unable to attempt due to transfer difficulty   Stairs            Wheelchair Mobility    Modified Rankin (Stroke Patients Only)       Balance                                    Cognition Arousal/Alertness: Awake/alert (initially difficult to arouse) Behavior During Therapy:  (groggy/slow/disoriented ) Overall Cognitive Status: No family/caregiver present to determine baseline cognitive functioning Area of Impairment: Orientation;Attention;Memory;Following commands;Safety/judgement;Awareness;Problem solving Orientation Level: Disoriented to     Following Commands: Follows one step commands inconsistently       General Comments: difficult to maintain alertness    Exercises      General Comments        Pertinent Vitals/Pain Pain Assessment: No/denies pain    Home Living                      Prior Function            PT Goals (current goals can now be found in the care plan section) Progress towards PT goals: Progressing toward goals    Frequency  Min 3X/week    PT Plan Current plan remains appropriate    Co-evaluation             End of Session Equipment Utilized During Treatment: Gait  belt Activity Tolerance: Other (comment) (limited by AMS) Patient left: in chair;with call bell/phone within reach;with family/visitor present;with chair alarm set     Time: 4098-1191 PT Time Calculation (min) (ACUTE ONLY): 25 min  Charges:  $Therapeutic Activity: 23-37 mins                    G Codes:      Felecia Shelling  PTA [facility]  Acute  Rehab Pager      [phone]

## 2015-11-05 NOTE — Progress Notes (Signed)
Initial Nutrition Assessment  DOCUMENTATION CODES:   Severe malnutrition in context of social or environmental circumstances  INTERVENTION:  - Continue Ensure Enlive po BID, each supplement provides 350 kcal and 20 grams of protein - Encourage PO intakes of meals and supplements - RD will continue to monitor for needs  NUTRITION DIAGNOSIS:   Inadequate oral intake related to lethargy/confusion, poor appetite as evidenced by meal completion < 50%.  GOAL:   Patient will meet greater than or equal to 90% of their needs  MONITOR:   PO intake, Supplement acceptance, Weight trends, Labs, Skin, I & O's  REASON FOR ASSESSMENT:   Low Braden  ASSESSMENT:   60 y.o. male with below PMH and medication noncompliance who comes to the ER due to having pain on his feet, which he stated were due to gout.  Pt seen for low Braden. BMI indicates normal weight/borderline underweight. Per chart review, pt ate 50% breakfast and dinner, 100% lunch on 3/25; 50% breakfast and lunch and 25% dinner 3/26. Intakes mainly </= 50%. No intakes documented from today. No family/visitors present at time of RD visit. Pt's speech very slurred and difficult to understand and he is unable to appropriately respond to questions or provide information from PTA.   Physical assessment shows moderate and severe muscle and moderate and severe fat wasting. No recent weight hx available in the chart for comparison. Pt likely not fully meeting needs now or PTA. Ensure Enlive already ordered BID; encourage pt with this supplement.   Per rounds this AM, pt with hx of alcohol abuse and on CIWA currently and also hx of medical noncompliance. Medications reviewed; 1 mg folic acid/day, 400 mg Mag-ox BID, 1 multivitamin with minerals/day, 40 mEq KCl every 4 hours, 100 mg thiamine/day. IVF: NS @ 75 mL/hr. Labs reviewed; Cl: 100 mmol/L, Ca: 8.3 mg/dL, Mg: 1.2 mg/dL.    Diet Order:  Diet Heart Room service appropriate?: Yes; Fluid  consistency:: Thin  Skin:  Wound (see comment) (Stage 2 sacral and R buttocks pressure injuries)  Last BM:  3/26  Height:   Ht Readings from Last 1 Encounters:  11/01/15 5\' 11"  (1.803 m)    Weight:   Wt Readings from Last 1 Encounters:  11/01/15 135 lb 9.3 oz (61.5 kg)    Ideal Body Weight:  78.18 kg (kg)  BMI:  Body mass index is 18.92 kg/(m^2).  Estimated Nutritional Needs:   Kcal:  1725-1925 (28-31 kcal/kg)  Protein:  60-70 grams  Fluid:  2 L/day  EDUCATION NEEDS:   No education needs identified at this time     Trenton GammonJessica Daesean Lazarz, RD, LDN Inpatient Clinical Dietitian Pager # [phone] After hours/weekend pager # [phone]

## 2015-11-05 NOTE — Progress Notes (Signed)
OT Cancellation Note  Patient Details Name: [name] [name]: [id number] DOB: [birth year]   Cancelled Treatment:    Reason Eval/Treat Not Completed: Fatigue/lethargy limiting ability to participate Will recheck on pt next day  Alba CoryREDDING, Nathalie Cavendish D 11/05/2015, 4:42 PM

## 2015-11-05 NOTE — Clinical Social Work Note (Signed)
Clinical Social Work Assessment  Patient Details  Name: [name] MRN: [id number] Date of Birth: [birth year]  Date of referral:  11/05/15               Reason for consult:  Facility Placement                Permission sought to share information with:    Permission granted to share information::     Name::        Agency::     Relationship::     Contact Information:     Housing/Transportation Living arrangements for the past 2 months:  Single Family Home Source of Information:  Other (Comment Required) (Sibling) Patient Interpreter Needed:    Criminal Activity/Legal Involvement Pertinent to Current Situation/Hospitalization:  No - Comment as needed Significant Relationships:  Siblings Lives with:  Siblings Do you feel safe going back to the place where you live?    Need for family participation in patient care:  No (Coment)  Care giving concerns:  Pt admitted from home with sister. PT recommending short-term rehab at a SNF.   Social Worker assessment / plan: CSW received PT recommendation. BSW Intern went by pt room, no family/friends in room. BSW Intern contacted pt sister via telephone. BSW Intern introduced self and explained role. BSW Intern explained recommendation for short-term rehab. BSW Intern inquired about pt's insurance. Pt sister reports she has attempted applying for insurance for pt in the past but pt was noncompliant with sister. [place] [facility] [place]. Pt sister is worried she will not be able to visit the pt if the facility is any further.   CSW [place] [facility] [place] via [employer] hub.  BSW Intern to follow-up with pt sister with bed offers.  CSW continuing to follow.  Employment status:  Unemployed Health and safety inspectornsurance information:  Other (Comment Required) (None) PT Recommendations:  Skilled Nursing Facility Information / Referral to community resources:  Skilled Nursing  Facility  Patient/Family's Response to care:  Pt disoriented to time, place, and situation. Pt sister is involved in pt care. Pt sister inquired about what the pt is being treated for. BSW Intern informed pt sister of treatment for pneumonia.   Patient/Family's Understanding of and Emotional Response to Diagnosis, Current Treatment, and Prognosis:  Pt sister reports no further questions at this time.   Emotional Assessment Appearance:  Appears stated age Attitude/Demeanor/Rapport:  Unable to Assess, Other (Disoriented to time, place, and situation) Affect (typically observed):  Unable to Assess, Other (Disoriented to time, place, and situation) Orientation:  Oriented to Self Alcohol / Substance use:  Alcohol Use Psych involvement (Current and /or in the community):  No (Comment)  Discharge Needs  Concerns to be addressed:  Care Coordination Readmission within the last 30 days:  No Current discharge risk:  None Barriers to Discharge:  Continued Medical Work up   [employer] CorporationCallie Sirenia Whitis, Student-SW 11/05/2015, 12:08 PM

## 2015-11-06 DIAGNOSIS — R1319 Other dysphagia: Secondary | ICD-10-CM

## 2015-11-06 DIAGNOSIS — R531 Weakness: Secondary | ICD-10-CM | POA: Insufficient documentation

## 2015-11-06 DIAGNOSIS — M6289 Other specified disorders of muscle: Secondary | ICD-10-CM

## 2015-11-06 MED ORDER — FOLIC ACID 1 MG PO TABS: 1.0000 mg | ORAL_TABLET | Freq: Every day | ORAL | Status: AC

## 2015-11-06 MED ORDER — PREDNISONE 20 MG PO TABS: ORAL_TABLET | ORAL | Status: AC

## 2015-11-06 MED ORDER — THIAMINE HCL 100 MG PO TABS: 100.0000 mg | ORAL_TABLET | Freq: Every day | ORAL | Status: AC

## 2015-11-06 MED ORDER — AMOXICILLIN-POT CLAVULANATE 875-125 MG PO TABS: 1.0000 | ORAL_TABLET | Freq: Two times a day (BID) | ORAL | Status: AC

## 2015-11-06 MED ORDER — PANTOPRAZOLE SODIUM 40 MG PO TBEC: 40.0000 mg | DELAYED_RELEASE_TABLET | Freq: Every day | ORAL | Status: AC

## 2015-11-06 MED ORDER — CLONIDINE HCL 0.1 MG PO TABS: 0.1000 mg | ORAL_TABLET | Freq: Two times a day (BID) | ORAL | Status: AC

## 2015-11-06 MED ORDER — ENSURE ENLIVE PO LIQD: 237.0000 mL | Freq: Two times a day (BID) | ORAL | Status: AC

## 2015-11-06 MED ORDER — MAGNESIUM OXIDE 400 (241.3 MG) MG PO TABS: 400.0000 mg | ORAL_TABLET | Freq: Two times a day (BID) | ORAL | Status: AC

## 2015-11-06 MED ORDER — CHLORDIAZEPOXIDE HCL 5 MG PO CAPS: 15.0000 mg | ORAL_CAPSULE | Freq: Three times a day (TID) | ORAL | Status: AC

## 2015-11-06 NOTE — Discharge Summary (Signed)
Physician Discharge Summary  [name] WUJ:[id number] DOB: 1956/05/08 DOA: 10/30/2015  PCP: No primary care provider on file.  Admit date: 10/30/2015 Discharge date: 11/06/2015  Time spent: 40 minutes  Recommendations for Outpatient Follow-up:  1. Repeat BMET and Mg level in 5 days to follow electrolytes and renal function  2. Repeat CXR in 3 weeks to assure resolution of infiltrates 3. Speech therapy and physical therapy evaluation/rehabilitation as per SNF protocol 4. Reassess BP and adjust antihypertensive regimen as needed  5. Repeat CBC in 1-2 weeks to follow Hgb trend   Discharge Diagnoses:  Principal Problem:   CAP (community acquired pneumonia) Active Problems:   Alcohol dependence (HCC)   Depression   Anxiety   Hypertension   Anemia   Hyponatremia   Abnormal LFTs   Hypoalbuminemia   Pressure ulcer   Protein-calorie malnutrition, severe Dysphagia Gout GERD  Discharge Condition: stable and improved. Discharge to SNF for rehabilitation and conditioning.   Diet recommendation: heart healthy diet   Filed Weights   10/31/15 0347 11/01/15 1500  Weight: 65.772 kg (145 lb) 61.5 kg (135 lb 9.3 oz)    History of present illness:  As per H&P written by Dr. Robb Matar on 10/30/15 60 y.o. male with below PMH and medication noncompliance who comes to the ER due to having pain on his feet, which he stated were due to gout. When the patient was seen in the emergency department, he was confused, tremulous, tachycardic, hypertensive and found to have symptoms of delirium tremens and was treated with benzodiazepines. He is currently sedated and unable to provide history.  Workup is significant for mild anemia, hyponatremia and a chest radiograph showing multifocal pneumonia.  Hospital Course:  CAP (community acquired pneumonia) -will continue PO Augmentin now and complete therapy; last day of antibiotics anticipated 3/31 -repeat CXR in 3-4 weeks to assure resolution of  infiltrates -WBC reassuring; and patient is afebrile at discharge -continue use of flutter valve -good O2 sat on RA   Alcohol dependence (HCC) and withdrawal  -cessation counseling provided -will continue librium TID schedule -CIWA score around 7-8 now; not needing any extra benzodiazepine treatments  -weaned slowly as tolerated    Depression/ Anxiety -outpatient referral to psychiatry service for further evaluation adn initiation of medications recommended -no SI or hallucinations    Hypertension - stable now - Urine drug screen negative for cocaine  - will continue treatment with clonidine BID (part of his HTN associated with withdrawal from alcohol most likely) -recommending heart healthy diet    Anemia -of chronic disease and presumably associated with alcohol use  -B12 level WNL -follow CBC intermittently to assess Hgb trend    Hyponatremia, hypokalemia and low magnesium  -Most likely due to history of alcoholism and contribution from PNA -NA improved with IVF's and essentially at normal limits at discharge -potassium repleted -Mag WNL at discharge -repeat BMET to follow electrolytes and continue repletion as needed     Abnormal LFTs -Could be related secondary to alcohol use -has trended down and is WNL now -will recommend monitoring intermittently    Hypoalbuminemia and severe protein calorie malnutrition  -associated with alcohol consumption and poor oral intake  -will continue using ensure BID   Pressure ulcer: sacral area, stage 1-2 -continue frequent mobilization/repositioning -barrier cream and preventive measures     Physical deconditioning/other dysphagia  -will needs SNF as per PT staff evaluation/rec's -will arrange for skilled nursing facility at discharge -mild intermittent dysphagia, will need SPL follow up an evaluation as  well   Left arm weakness and joint pain: appears to be secondary to GOU -no focal deficit  appreciated -current physical deconditioning and gout responsible to condition  -CT scan of his head neg for acute abnormalities   Procedures:  See below for x-ray reports   Consultations:  None   Discharge Exam: Filed Vitals:   11/06/15 1003 11/06/15 1315  BP: 139/73 145/85  Pulse: 124 113  Temp: 97.7 F (36.5 C) 100.1 F (37.8 C)  Resp: 20 20    General: Pt in nad, alert and awake, able to follow commands and cooperative with examination. Still slightly confused/somnolent, but easy to arouse. Found by PT staff to required SNF for rehab. Denies CP and SOB. Low grade temp appreciated overnight; otherwise hemodynamically stable and ready for discharge.   Cardiovascular: rrr, no mrg  Respiratory: good air movement bilaterally; no wheezing, positive rhonchi, no crackles  Abdomen: soft, nt, nd  Musculoskeletal: no cyanosis or clubbing   Discharge Instructions   Discharge Instructions    Diet - low sodium heart healthy    Complete by:  As directed      Discharge instructions    Complete by:  As directed   Good hydration Medications as prescribed Continue librium  Patient to follow rehabilitation with physical therapy as per SNF protocol Also will need speech therapy evaluation; as he experienced mild intermittent episodes of coughing while eating (could be associated with not been 100% alert during that time vs underlying dysphagia flouring now due to deconditioning)     Increase activity slowly    Complete by:  As directed           Current Discharge Medication List    START taking these medications   Details  amoxicillin-clavulanate (AUGMENTIN) 875-125 MG tablet Take 1 tablet by mouth every 12 (twelve) hours.    chlordiazePOXIDE (LIBRIUM) 5 MG capsule Take 3 capsules (15 mg total) by mouth 3 (three) times daily. Qty: 30 capsule, Refills: 0    cloNIDine (CATAPRES) 0.1 MG tablet Take 1 tablet (0.1 mg total) by mouth 2 (two) times daily.    feeding  supplement, ENSURE ENLIVE, (ENSURE ENLIVE) LIQD Take 237 mLs by mouth 2 (two) times daily between meals.    folic acid (FOLVITE) 1 MG tablet Take 1 tablet (1 mg total) by mouth daily.    magnesium oxide (MAG-OX) 400 (241.3 Mg) MG tablet Take 1 tablet (400 mg total) by mouth 2 (two) times daily.    naproxen (NAPROSYN) 500 MG tablet Take 1 tablet (500 mg total) by mouth 2 (two) times daily. Qty: 30 tablet, Refills: 0    pantoprazole (PROTONIX) 40 MG tablet Take 1 tablet (40 mg total) by mouth daily.    thiamine 100 MG tablet Take 1 tablet (100 mg total) by mouth daily.      CONTINUE these medications which have CHANGED   Details  predniSONE (DELTASONE) 20 MG tablet Take 2 tablets by mouth daily X 3 days; then 1 tablet by mouth daily X 3 days; then 1/2 tablet by mouth daily X 3 days and stop prednisone      STOP taking these medications     indomethacin (INDOCIN) 25 MG capsule      oxyCODONE-acetaminophen (PERCOCET/ROXICET) 5-325 MG per tablet      oxyCODONE-acetaminophen (PERCOCET/ROXICET) 5-325 MG per tablet        No Known Allergies Follow-up Information    Schedule an appointment as soon as possible for a visit with [facility].   Contact information:   [address] [place] [address] [phone]      Follow up with [facility].   Specialty:  Skilled Nursing Facility   Contact information:   [address] [place] [address] [phone]       The results of significant diagnostics from this hospitalization (including imaging, microbiology, ancillary and laboratory) are listed below for reference.    Significant Diagnostic Studies: Dg Chest 2 View  10/30/2015  CLINICAL DATA:  Weakness in the legs. Bowel in urinary incontinence for year. EXAM: CHEST  2 VIEW COMPARISON:  09/14/2012 FINDINGS: New airspace infiltration with volume loss in the right upper lung. New airspace  infiltration in the left mid lung. Changes likely represent pneumonia. Followup PA and lateral chest X-ray is recommended in 3-4 weeks following trial of antibiotic therapy to ensure resolution and exclude underlying malignancy. Heart size and pulmonary vascularity are normal. No blunting of costophrenic angles. No pneumothorax. Degenerative changes in the spine and shoulders. IMPRESSION: New airspace infiltration and volume loss in the right upper lung and new airspace infiltration in the left mid lung most likely represent multifocal pneumonia. Followup PA and lateral chest X-ray is recommended in 3-4 weeks following trial of antibiotic therapy to ensure resolution and exclude underlying malignancy. Electronically Signed   By: Burman Nieves M.D.   On: 10/30/2015 20:38   Ct Head Wo Contrast  11/05/2015  CLINICAL DATA:  Lethargy, confusion. EXAM: CT HEAD WITHOUT CONTRAST TECHNIQUE: Contiguous axial images were obtained from the base of the skull through the vertex without intravenous contrast. COMPARISON:  Head CT and brain MRI dated 09/14/2012. FINDINGS: Brain: There is generalized brain atrophy, moderate in degree for age, with commensurate dilatation of the ventricles and sulci. There is no mass, hemorrhage, edema or other evidence of acute parenchymal abnormality. No extra-axial hemorrhage. Vascular: No hyperdense vessel or unexpected calcification. There are chronic calcified atherosclerotic changes of the large vessels at the skull base. Skull: Negative for fracture or focal lesion. Sinuses/Orbits: No acute findings. Mucosal thickening within the right frontal sinus, most likely chronic. Other: None. IMPRESSION: No acute findings. No intracranial mass, hemorrhage or edema. No skull fracture. Electronically Signed   By: Bary Richard M.D.   On: 11/05/2015 16:01    Microbiology: Recent Results (from the past 240 hour(s))  Blood culture (routine x 2)     Status: None   Collection Time: 10/30/15  8:35  PM  Result Value Ref Range Status   Specimen Description BLOOD RIGHT ARM  Final   Special Requests BOTTLES DRAWN AEROBIC AND ANAEROBIC  Final   Culture   Final    NO GROWTH 5 DAYS Performed at [facility]    Report Status 11/04/2015 FINAL  Final  Blood culture (routine x 2)     Status: None   Collection Time: 10/30/15 10:05 PM  Result Value Ref Range Status   Specimen Description BLOOD RIGHT ANTECUBITAL  Final   Special Requests BOTTLES DRAWN AEROBIC AND ANAEROBIC 9 ML  Final   Culture   Final    NO GROWTH 5 DAYS Performed at [facility]    Report Status 11/04/2015 FINAL  Final  MRSA PCR Screening     Status: None   Collection Time: 11/01/15 10:20 AM  Result Value Ref Range Status   MRSA by PCR NEGATIVE NEGATIVE Final    Comment:        The  GeneXpert MRSA Assay (FDA approved for NASAL specimens only), is one component of a comprehensive MRSA colonization surveillance program. It is not intended to diagnose MRSA infection nor to guide or monitor treatment for MRSA infections.      Labs: Basic Metabolic Panel:  Recent Labs Lab 10/31/15 0113 10/31/15 0540 11/01/15 0455 11/02/15 0440 11/04/15 0414 11/05/15 0459  NA  --  131* 130* 133* 130* 135  K  --  3.8 3.2* 3.9 3.2* 3.9  CL  --  95* 92* 99* 95* 100*  CO2  --  23 26 24 24 24   GLUCOSE  --  94 102* 101* 104* 101*  BUN  --  7 6 6  5* 7  CREATININE  --  0.66 0.78 0.64 0.59* 0.62  CALCIUM  --  8.6* 8.2* 8.4* 8.1* 8.3*  MG 1.1*  --   --  1.2* 1.2*  --   PHOS 4.5  --  3.9  --   --   --    Liver Function Tests:  Recent Labs Lab 10/30/15 2019 10/31/15 0540 11/01/15 0455  AST 81* 61* 39  ALT 41 34 25  ALKPHOS 96 87 75  BILITOT 1.2 1.1 1.2  PROT 8.2* 7.3 7.0  ALBUMIN 2.8* 2.6* 2.4*   CBC:  Recent Labs Lab 10/30/15 2019 10/31/15 0455 11/01/15 0455 11/02/15 0440  WBC 9.9 9.7 9.6 10.5  NEUTROABS 6.7 6.4 6.8  --   HGB 12.5* 13.1 12.6* 11.9*  HCT 36.7* 38.0* 35.0* 33.9*  MCV 78.4  78.7 76.1* 76.4*  PLT 269 262 278 294    Signed:  Vassie LollMadera, Zaina Jenkin MD.  [facility] 11/06/2015, 2:09 PM

## 2015-11-06 NOTE — Evaluation (Signed)
Occupational Therapy Evaluation Patient Details Name: [name] MRN: [id number] DOB: 1955-12-27 Today's Date: 11/06/2015    History of Present Illness Pt is a 60 year old male with hx of anxiety, depression, acute encephalopathy, alcohol dependence and admitted for CAP and alcohol dependence with withdrawal   Clinical Impression   Pt was admitted for the above.  He essentially needs total A +2 for any bed mobility for adls.  Pt participated but has a lot of weakness and ataxic movement of RUE, which is stronger of the 2 UEs.  Goals are for mod A grooming; max A rolling for adls and following commands    Follow Up Recommendations  SNF    Equipment Recommendations  None recommended by OT    Recommendations for Other Services       Precautions / Restrictions Precautions Precautions: Fall Precaution Comments: AMS Restrictions Weight Bearing Restrictions: No      Mobility Bed Mobility     Rolling: Total assist         General bed mobility comments: pt did not assist with rolling:  stated, "I don't want to"  Transfers                 General transfer comment: not tested    Balance                                            ADL Overall ADL's : Needs assistance/impaired     Grooming: Wash/dry face;Maximal assistance;Bed level                                 General ADL Comments: pt needs total A for adls except for grooming.  Pt did not assist with rolling.  Pt tended to push head and trunk to R side of bed.       Vision     Perception     Praxis      Pertinent Vitals/Pain Pain Assessment: Faces Faces Pain Scale: Hurts whole lot Pain Location: L wrist Pain Descriptors / Indicators: Grimacing Pain Intervention(s): Limited activity within patient's tolerance;Monitored during session;Premedicated before session;Repositioned;Ice applied     Hand Dominance     Extremity/Trunk Assessment Upper Extremity  Assessment Upper Extremity Assessment: RUE deficits/detail;LUE deficits/detail RUE Deficits / Details: UE weakness and ataxic movements. Pt is able to reach to face with RUE. Approximately 30 degrees shoulder FF, elbow 60 degrees range; able to make loose fist and wrist is flexed with about 10 degrees AROM observed  LUE Deficits / Details: LUE, no AROM noted in shoulder, about 30 degrees noted in elbow AROM, wrist stiff and painful--per notes likely to gout;  min finger AROM noted           Communication Communication Communication:  (very difficult to understand)   Cognition Arousal/Alertness: Awake/alert   Overall Cognitive Status: No family/caregiver present to determine baseline cognitive functioning Area of Impairment: Orientation;Attention;Memory;Following commands;Safety/judgement;Awareness;Problem solving Orientation Level: Time;Situation (knows [facility])     Following Commands: Follows one step commands inconsistently       General Comments: very difficult to understand   General Comments       Exercises       Shoulder Instructions      Home Living Family/patient expects to be discharged to:: Unsure  Prior Functioning/Environment          Comments: unknown    OT Diagnosis: Generalized weakness;Acute pain   OT Problem List: Decreased strength;Decreased activity tolerance;Impaired balance (sitting and/or standing);Decreased cognition;Pain   OT Treatment/Interventions: Self-care/ADL training;Cognitive remediation/compensation;Therapeutic activities;DME and/or AE instruction;Patient/family education    OT Goals(Current goals can be found in the care plan section) Acute Rehab OT Goals Patient Stated Goal: none stated OT Goal Formulation: Patient unable to participate in goal setting Time For Goal Achievement: 11/20/15 Potential to Achieve Goals: Fair ADL Goals Pt Will Perform Grooming: with mod assist;bed  level (wash hands and face) Additional ADL Goal #1: pt will roll to L with max A and multimodal cues Additional ADL Goal #2: pt will follow basic functional/motor commands within 10 seconds 50% of time  OT Frequency: Min 2X/week   Barriers to D/C:            Co-evaluation              End of Session    Activity Tolerance:   Patient left: in bed;with call bell/phone within reach;with bed alarm set   Time: 1353-1404 OT Time Calculation (min): 11 min Charges:  OT General Charges $OT Visit: 1 Procedure OT Evaluation $OT Eval Moderate Complexity: 1 Procedure G-Codes:    [name] 11-26-15, 2:43 PM   [name], OTR/L [phone] 11/26/15

## 2015-11-06 NOTE — Clinical Social Work Placement (Signed)
Patient is set to discharge to [facility] today. Patient & sister aware. Discharge packet given to RN, Tess. [facility] called for transport.     [name], LCSW [facility] Clinical Social Worker cell #: [phone]    CLINICAL SOCIAL WORK PLACEMENT  NOTE  Date:  11/06/2015  Patient Details  Name: [name] MRN: [id number] Date of Birth: September 08, 1955  Clinical Social Work is seeking post-discharge placement for this patient at the Skilled  Nursing Facility level of care (*CSW will initial, date and re-position this form in  chart as items are completed):  Yes   Patient/family provided with [facility] Clinical Social Work Department's list of facilities offering this level of care within the geographic area requested by the patient (or if unable, by the patient's family).  Yes   Patient/family informed of their freedom to choose among providers that offer the needed level of care, that participate in Medicare, Medicaid or managed care program needed by the patient, have an available bed and are willing to accept the patient.  Yes   Patient/family informed of [facility] ownership interest in [facility] and [facility], as well as of the fact that they are under no obligation to receive care at these facilities.  PASRR submitted to EDS on 11/05/15     PASRR number received on       Existing PASRR number confirmed on       FL2 transmitted to all facilities in geographic area requested by pt/family on 11/05/15     FL2 transmitted to all facilities within larger geographic area on 11/05/15     Patient informed that his/her managed care company has contracts with or will negotiate with certain facilities, including the following:        Yes   Patient/family informed of bed offers received.  [facility] bed [facility]     Physician recommends and patient chooses bed at      [facility] to [facility] to  [facility] on 11/06/15.  Patient to be transferred to facility by [facility]     Patient family notified on 11/06/15 of transfer.  Name of family member notified:  patient's sister via phone     PHYSICIAN       Additional Comment:    _______________________________________________ [name], [name], LCSW 11/06/2015, 4:06 PM

## 2015-12-10 DEATH — deceased
